# Patient Record
Sex: Male | Born: 1965 | Race: Black or African American | Hispanic: No | Marital: Married | Smoking: Never smoker
Health system: Southern US, Community
[De-identification: ages and names within clinical notes are randomized; demographics above are authoritative.]

## PROBLEM LIST (undated history)

## (undated) DIAGNOSIS — H5200 Hypermetropia, unspecified eye: Secondary | ICD-10-CM

## (undated) DIAGNOSIS — I10 Essential (primary) hypertension: Secondary | ICD-10-CM

## (undated) HISTORY — PX: FOOT SURGERY: SHX648

## (undated) HISTORY — DX: Hypermetropia, unspecified eye: H52.00

## (undated) HISTORY — DX: Essential (primary) hypertension: I10

---

## 2011-10-26 ENCOUNTER — Ambulatory Visit (INDEPENDENT_AMBULATORY_CARE_PROVIDER_SITE_OTHER): Payer: 59 | Admitting: Medical

## 2011-10-26 ENCOUNTER — Encounter: Payer: Self-pay | Admitting: Medical

## 2011-10-26 DIAGNOSIS — N508 Other specified disorders of male genital organs: Secondary | ICD-10-CM

## 2011-10-26 DIAGNOSIS — Z0000 Encounter for general adult medical examination without abnormal findings: Secondary | ICD-10-CM

## 2011-10-26 DIAGNOSIS — E119 Type 2 diabetes mellitus without complications: Secondary | ICD-10-CM | POA: Insufficient documentation

## 2011-10-26 DIAGNOSIS — N5089 Other specified disorders of the male genital organs: Secondary | ICD-10-CM | POA: Insufficient documentation

## 2011-10-26 DIAGNOSIS — I10 Essential (primary) hypertension: Secondary | ICD-10-CM

## 2011-10-26 DIAGNOSIS — E1159 Type 2 diabetes mellitus with other circulatory complications: Secondary | ICD-10-CM | POA: Insufficient documentation

## 2011-10-26 DIAGNOSIS — J40 Bronchitis, not specified as acute or chronic: Secondary | ICD-10-CM

## 2011-10-26 LAB — COMPREHENSIVE METABOLIC PANEL
Albumin: 5 g/dL (ref 3.5–5.2)
BUN: 16 mg/dL (ref 6–23)
CO2: 25 mEq/L (ref 19–32)
Calcium: 9.8 mg/dL (ref 8.4–10.5)
Chloride: 97 mEq/L (ref 96–112)
Glucose, Bld: 337 mg/dL — ABNORMAL HIGH (ref 70–99)
Potassium: 4.5 mEq/L (ref 3.5–5.3)

## 2011-10-26 LAB — LIPID PANEL
Cholesterol: 258 mg/dL — ABNORMAL HIGH (ref 0–200)
Total CHOL/HDL Ratio: 6.8 Ratio
Triglycerides: 364 mg/dL — ABNORMAL HIGH (ref <150)
VLDL: 73 mg/dL — ABNORMAL HIGH (ref 0–40)

## 2011-10-26 LAB — CBC WITH DIFFERENTIAL/PLATELET
Basophils Relative: 0 % (ref 0–1)
Hemoglobin: 13 g/dL (ref 13.0–17.0)
Lymphs Abs: 2.4 10*3/uL (ref 0.7–4.0)
Monocytes Relative: 3 % (ref 3–12)
Neutro Abs: 3.8 10*3/uL (ref 1.7–7.7)
Neutrophils Relative %: 59 % (ref 43–77)
RBC: 4.75 MIL/uL (ref 4.22–5.81)

## 2011-10-26 LAB — POCT URINALYSIS DIPSTICK
Leukocytes, UA: NEGATIVE
Spec Grav, UA: 1.015
Urobilinogen, UA: NEGATIVE

## 2011-10-26 LAB — PSA: PSA: 0.45 ng/mL (ref <=4.00)

## 2011-10-26 MED ORDER — METFORMIN HCL 500 MG PO TABS: 500.0000 mg | ORAL_TABLET | Freq: Two times a day (BID) | ORAL | Status: DC

## 2011-10-26 MED ORDER — LISINOPRIL 10 MG PO TABS: 10.0000 mg | ORAL_TABLET | Freq: Every day | ORAL | Status: DC

## 2011-10-26 MED ORDER — GLIPIZIDE 5 MG PO TABS: 5.0000 mg | ORAL_TABLET | Freq: Two times a day (BID) | ORAL | Status: DC

## 2011-10-26 MED ORDER — HYDROCHLOROTHIAZIDE 12.5 MG PO CAPS: 12.5000 mg | ORAL_CAPSULE | Freq: Every day | ORAL | Status: DC

## 2011-10-26 MED ORDER — AMOXICILLIN-POT CLAVULANATE 875-125 MG PO TABS: 1.0000 | ORAL_TABLET | Freq: Two times a day (BID) | ORAL | Status: AC

## 2011-10-26 NOTE — Patient Instructions (Signed)
Preventative Care for Adults, Male       REGULAR HEALTH EXAMS:  A routine yearly physical is a good way to check in with your primary care provider about your health and preventive screening. It is also an opportunity to share updates about your health and any concerns you have, and receive a thorough all-over exam.   Most health insurance companies pay for at least some preventative services.  Check with your health plan for specific coverages.  WHAT PREVENTATIVE SERVICES DO MEN NEED?  Adult men should have their weight and blood pressure checked regularly.   Men age 35 and older should have their cholesterol levels checked regularly.  Beginning at age 50 and continuing to age 75, men should be screened for colorectal cancer.  Certain people should may need continued testing until age 85.  Other cancer screening may include exams for testicular and prostate cancer.  Updating vaccinations is part of preventative care.  Vaccinations help protect against diseases such as the flu.  Lab tests are generally done as part of preventative care to screen for anemia and blood disorders, to screen for problems with the kidneys and liver, to screen for bladder problems, to check blood sugar, and to check your cholesterol level.  Preventative services generally include counseling about diet, exercise, avoiding tobacco, drugs, excessive alcohol consumption, and sexually transmitted infections.    GENERAL RECOMMENDATIONS FOR GOOD HEALTH:  Healthy diet:  Eat a variety of foods, including fruit, vegetables, animal or vegetable protein, such as meat, fish, chicken, and eggs, or beans, lentils, tofu, and grains, such as rice.  Drink plenty of water daily.  Decrease saturated fat in the diet, avoid lots of red meat, processed foods, sweets, fast foods, and fried foods.  Exercise:  Aerobic exercise helps maintain good heart health. At least 30-40 minutes of moderate-intensity exercise is recommended.  For example, a brisk walk that increases your heart rate and breathing. This should be done on most days of the week.   Find a type of exercise or a variety of exercises that you enjoy so that it becomes a part of your daily life.  Examples are running, walking, swimming, water aerobics, and biking.  For motivation and support, explore group exercise such as aerobic class, spin class, Zumba, Yoga,or  martial arts, etc.    Set exercise goals for yourself, such as a certain weight goal, walk or run in a race such as a 5k walk/run.  Speak to your primary care provider about exercise goals.  Disease prevention:  If you smoke or chew tobacco, find out from your caregiver how to quit. It can literally save your life, no matter how long you have been a tobacco user. If you do not use tobacco, never begin.   Maintain a healthy diet and normal weight. Increased weight leads to problems with blood pressure and diabetes.   The Body Mass Index or BMI is a way of measuring how much of your body is fat. Having a BMI above 27 increases the risk of heart disease, diabetes, hypertension, stroke and other problems related to obesity. Your caregiver can help determine your BMI and based on it develop an exercise and dietary program to help you achieve or maintain this important measurement at a healthful level.  High blood pressure causes heart and blood vessel problems.  Persistent high blood pressure should be treated with medicine if weight loss and exercise do not work.   Fat and cholesterol leaves deposits in your arteries   that can block them. This causes heart disease and vessel disease elsewhere in your body.  If your cholesterol is found to be high, or if you have heart disease or certain other medical conditions, then you may need to have your cholesterol monitored frequently and be treated with medication.   Ask if you should have a stress test if your history suggests this. A stress test is a test done on  a treadmill that looks for heart disease. This test can find disease prior to there being a problem.  Avoid drinking alcohol in excess (more than two drinks per day).  Avoid use of street drugs. Do not share needles with anyone. Ask for professional help if you need assistance or instructions on stopping the use of alcohol, cigarettes, and/or drugs.  Brush your teeth twice a day with fluoride toothpaste, and floss once a day. Good oral hygiene prevents tooth decay and gum disease. The problems can be painful, unattractive, and can cause other health problems. Visit your dentist for a routine oral and dental check up and preventive care every 6-12 months.   Look at your skin regularly.  Use a mirror to look at your back. Notify your caregivers of changes in moles, especially if there are changes in shapes, colors, a size larger than a pencil eraser, an irregular border, or development of new moles.  Safety:  Use seatbelts 100% of the time, whether driving or as a passenger.  Use safety devices such as hearing protection if you work in environments with loud noise or significant background noise.  Use safety glasses when doing any work that could send debris in to the eyes.  Use a helmet if you ride a bike or motorcycle.  Use appropriate safety gear for contact sports.  Talk to your caregiver about gun safety.  Use sunscreen with a SPF (or skin protection factor) of 15 or greater.  Lighter skinned people are at a greater risk of skin cancer. Don't forget to also wear sunglasses in order to protect your eyes from too much damaging sunlight. Damaging sunlight can accelerate cataract formation.   Practice safe sex. Use condoms. Condoms are used for birth control and to help reduce the spread of sexually transmitted infections (or STIs).  Some of the STIs are gonorrhea (the clap), chlamydia, syphilis, trichomonas, herpes, HPV (human papilloma virus) and HIV (human immunodeficiency virus) which causes AIDS.  The herpes, HIV and HPV are viral illnesses that have no cure. These can result in disability, cancer and death.   Keep carbon monoxide and smoke detectors in your home functioning at all times. Change the batteries every 6 months or use a model that plugs into the wall.   Vaccinations:  Stay up to date with your tetanus shots and other required immunizations. You should have a booster for tetanus every 10 years. Be sure to get your flu shot every year, since 5%-20% of the [place] population comes down with the flu. The flu vaccine changes each year, so being vaccinated once is not enough. Get your shot in the fall, before the flu season peaks.   Other vaccines to consider:  Pneumococcal vaccine to protect against certain types of pneumonia.  This is normally recommended for adults age 65 or older.  However, adults younger than 45 years old with certain underlying conditions such as diabetes, heart or lung disease should also receive the vaccine.  Shingles vaccine to protect against Varicella Zoster if you are older than age 60, or younger   than 45 years old with certain underlying illness.  Hepatitis A vaccine to protect against a form of infection of the liver by a virus acquired from food.  Hepatitis B vaccine to protect against a form of infection of the liver by a virus acquired from blood or body fluids, particularly if you work in health care.  If you plan to travel internationally, check with your local health department for specific vaccination recommendations.  Cancer Screening:  Most routine colon cancer screening begins at the age of 50. On a yearly basis, doctors may provide special easy to use take-home tests to check for hidden blood in the stool. Sigmoidoscopy or colonoscopy can detect the earliest forms of colon cancer and is life saving. These tests use a small camera at the end of a tube to directly examine the colon. Speak to your caregiver about this at age 50, when routine  screening begins (and is repeated every 5 years unless early forms of pre-cancerous polyps or small growths are found).   At the age of 50 men usually start screening for prostate cancer every year. Screening may begin at a younger age for those with higher risk. Those at higher risk include African-Americans or having a family history of prostate cancer. There are two types of tests for prostate cancer:   Prostate-specific antigen (PSA) testing. Recent studies raise questions about prostate cancer using PSA and you should discuss this with your caregiver.   Digital rectal exam (in which your doctor's lubricated and gloved finger feels for enlargement of the prostate through the anus).   Screening for testicular cancer.  Do a monthly exam of your testicles. Gently roll each testicle between your thumb and fingers, feeling for any abnormal lumps. The best time to do this is after a hot shower or bath when the tissues are looser. Notify your caregivers of any lumps, tenderness or changes in size or shape immediately.     

## 2011-10-26 NOTE — Progress Notes (Signed)
Subjective:   HPI  Paul Dudley is a 45 y.o. male who presents as a new patient today for a complete physical.  Accompanied by his wife today.  He moved here from [place], [place], and last physician was Dr. Derrill Memo.  He reports 2 wk hx/o cold, cough, congestion in chest and throat, productive sputum, subj fever, using Tylenol Cold and Robitussin with expectorant which helps some.  Last illness was 1.5 year ago.   Has hx/o diabetes type II.   Checks glucose regularly, never has readings under 150.  Usually 160-180 fasting.  Afternoons after getting off work usually around 180s.  Checks feet daily.  Last eye exam within the year.  Doesn't check blood pressure regularly.  Needs refills on his medications, usually gets 90 day supply.  No other aggravating or relieving factors.  Last tdap 2011.  No other c/o.  The following portions of the patient's history were reviewed and updated as appropriate: allergies, current medications, past family history, past medical history, past social history, past surgical history and problem list.   Past Medical History  Diagnosis Date  . Diabetes mellitus 04/2006  . Hypertension   . Farsightedness     wears glasses    Past Surgical History  Procedure Date  . Foot surgery     foreign body removal (glass), left foot    Family History  Problem Relation Age of Onset  . Cancer Mother     small cell lung cancer  . Diabetes Father   . Blindness Father   . Kidney disease Father     dialysis  . Cancer Brother     died testicular cancer 71 yo  . Cancer Maternal Uncle     small cell lung cancer  . Heart disease Paternal Uncle   . Stroke Maternal Grandmother   . Hypertension Maternal Grandmother     History   Social History  . Marital Status: Married    Spouse Name: N/A    Number of Children: N/A  . Years of Education: N/A   Occupational History  . 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 Smoking status: Never Smoker   .  Smokeless tobacco: Not on file  . Alcohol Use: No  . Drug Use: No  . Sexually Active: Not on file   Other Topics Concern  . 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    Allergies  Allergen Reactions  . Erythromycin    Review of Systems Constitutional: +fever, +chills, +sweats, -unexpected -weight change,+fatigue ENT: -runny nose, -ear pain, +sore throat Cardiology:  -chest pain, -palpitations, -edema Respiratory: +cough, -shortness of breath, -wheezing Gastroenterology: -abdominal pain, -nausea, -vomiting, +diarrhea, -constipation Hematology: -bleeding or bruising problems Musculoskeletal: -arthralgias, -myalgias, -joint swelling, +back pain Ophthalmology: -vision changes Urology: -dysuria, -difficulty urinating, -hematuria, +urinary frequency, -urgency Neurology: -headache, -weakness, -tingling, -numbness       Objective:   Physical Exam  Filed Vitals:   10/26/11 1415  BP: 140/80  Pulse: 88  Temp: 98.2 F (36.8 C)  Resp: 16    General appearance: alert, no distress, WD/WN, black male Skin: no foot lesions, no worrisome lesions HEENT: normocephalic, conjunctiva/corneas normal, sclerae anicteric, PERRLA, EOMi, nares patent, no discharge or erythema, pharynx normal Oral cavity: MMM,  tongue normal, teeth in good repair Neck: supple, no lymphadenopathy, no thyromegaly, no masses, normal ROM, no bruits Chest: non tender, normal shape and expansion Heart: RRR, normal S1, S2, no murmurs Lungs: CTA bilaterally, no wheezes, rhonchi, or rales Abdomen: +bs, soft, non tender, non distended, no masses, no hepatomegaly, no splenomegaly, no bruits Back: non tender, normal ROM, no scoliosis Musculoskeletal: upper extremities non tender, no obvious deformity,  normal ROM throughout, lower extremities non tender, no obvious deformity, normal ROM throughout Extremities: no edema, no cyanosis, no clubbing Pulses: 2+ symmetric, upper and lower extremities, normal cap refill Neurological: alert, oriented x 3, CN2-12 intact, strength normal upper extremities and lower extremities, sensation normal throughout, DTRs 2+ throughout, no cerebellar signs, gait normal, normal monofilament exam Psychiatric: normal affect, behavior normal, pleasant  GU: right scrotum with 1.5cm x 2cm nodular solid mass superior to the testicle, nontender, otherwise normal male external genitalia, nontender, no masses, no hernia, no lymphadenopathy Rectal: anus normal, prostate WNL, no nodules/mass   Assessment and Plan :    Encounter Diagnoses  Name Primary?  . General medical examination Yes  . Type II or unspecified type diabetes mellitus without mention of complication, not stated as uncontrolled   . Essential hypertension, benign   . Bronchitis   . Scrotal mass     Physical exam - discussed healthy lifestyle, diet, exercise, preventative care, vaccinations, and addressed their concerns.  Declines flu and pneumococcal vaccines.    DM type II - been out of medication for 3 mo.   Refilled medication, labs today, and will likely need to modify medications/regimen in 67mo.  HTN - c/t same medication, low salt diet  Bronchitis - script for Augmentin, discussed supportive care  Scrotal mass - ultrasound   Follow-up pending studies

## 2011-10-30 ENCOUNTER — Telehealth: Payer: Self-pay | Admitting: Family Medicine

## 2011-10-30 NOTE — Telephone Encounter (Signed)
Message copied by Janeice Robinson on Tue Oct 30, 2011  9:44 AM ------      Message from: [place], Kermit Balo      Created: Fri Oct 26, 2011 10:41 PM       Reminder - order for scrotal ultrasound

## 2011-10-30 NOTE — Telephone Encounter (Signed)
PATIENT HAS HIS U/S APPT. ON 10/31/11 @1145AM  [facility]. CLS

## 2011-10-31 ENCOUNTER — Other Ambulatory Visit: Payer: 59

## 2011-11-02 ENCOUNTER — Other Ambulatory Visit: Payer: 59

## 2011-11-07 ENCOUNTER — Ambulatory Visit
Admission: RE | Admit: 2011-11-07 | Discharge: 2011-11-07 | Disposition: A | Discharge status: Home / Self Care | Payer: 59 | Source: Ambulatory Visit | Attending: Medical | Admitting: Medical

## 2012-06-12 ENCOUNTER — Ambulatory Visit (INDEPENDENT_AMBULATORY_CARE_PROVIDER_SITE_OTHER): Payer: Self-pay | Admitting: Family Medicine

## 2012-06-12 ENCOUNTER — Telehealth: Payer: Self-pay | Admitting: Internal Medicine

## 2012-06-12 ENCOUNTER — Encounter: Payer: Self-pay | Admitting: Family Medicine

## 2012-06-12 ENCOUNTER — Other Ambulatory Visit: Payer: Self-pay

## 2012-06-12 ENCOUNTER — Telehealth: Payer: Self-pay

## 2012-06-12 VITALS — BP 128/80 | HR 109 | Ht 67.0 in | Wt 168.0 lb

## 2012-06-12 DIAGNOSIS — E119 Type 2 diabetes mellitus without complications: Secondary | ICD-10-CM

## 2012-06-12 LAB — POCT GLYCOSYLATED HEMOGLOBIN (HGB A1C): Hemoglobin A1C: 10.7

## 2012-06-12 MED ORDER — METFORMIN HCL ER 750 MG PO TB24: 750.0000 mg | ORAL_TABLET | Freq: Two times a day (BID) | ORAL | Status: DC

## 2012-06-12 MED ORDER — PIOGLITAZONE HCL-METFORMIN HCL 15-850 MG PO TABS: 1.0000 | ORAL_TABLET | Freq: Two times a day (BID) | ORAL | Status: DC

## 2012-06-12 NOTE — Telephone Encounter (Signed)
Done by cheri °

## 2012-06-12 NOTE — Telephone Encounter (Signed)
Check with the pharmacist and did divide Actos up giving 15 mg twice a day as well as metformin 750 ER twice a day

## 2012-06-12 NOTE — Telephone Encounter (Signed)
Dr.lalonde to get the actos gen. 15 mg bid is 360.00 a month and the Metformin 750 er mg bid is 25.97 so this is more that the actos plus met 15-850 bid which is 271.00 pt cant afforded please advise

## 2012-06-12 NOTE — Patient Instructions (Addendum)
Sugars before eating in the low 100s and after eating below 180. If you have problems with diarrhea call me.

## 2012-06-12 NOTE — Progress Notes (Signed)
  Subjective:    Patient ID: Paul Dudley, male    DOB: 1965/12/20, 46 y.o.   MRN: [id number]  HPI He is here for followup visit. He has been taking his metformin and Glucotrol regularly. He also continues on his other medications. He has had no difficulties. He was diagnosed with diabetes several years ago when he was living in [place]. He does state that he has been through diabetes education process and has a good handle on it.  Review of Systems     Objective:   Physical Exam Alert and in no distress. Hemoglobin A1c is 10.7       Assessment & Plan:   1. Diabetes mellitus  HgB A1c, Amb ref to [facility]-M[facility], pioglitazone-metformin (ACTOPLUS MET) 15-850 MG per tablet   paperwork was filled out to allow him to return to work. He is to monitor his blood sugars. The pharmacy cost her Actos plus was too expensive and will therefore divide the dosing up.

## 2012-06-12 NOTE — Telephone Encounter (Signed)
Will you do this too

## 2012-06-12 NOTE — Telephone Encounter (Signed)
PT INFORMED AND UNDERSTANDS

## 2012-06-12 NOTE — Telephone Encounter (Signed)
Give him the highest form of extended release that we can twice per day and have him keep his appointment. Explained that we really don't have any other choice at this time and keep him on the 2 medications he is on only metformin at a higher dose

## 2012-06-13 ENCOUNTER — Encounter: Payer: Self-pay | Admitting: Family Medicine

## 2012-06-18 ENCOUNTER — Telehealth: Payer: Self-pay

## 2012-06-18 NOTE — Telephone Encounter (Signed)
Printed out phone message due to patient only having an paper chart.

## 2012-06-18 NOTE — Telephone Encounter (Signed)
Pt needs a copy of his long form.   Please call when ready for pick kup [phone]

## 2012-06-24 ENCOUNTER — Telehealth: Payer: Self-pay | Admitting: Internal Medicine

## 2012-06-24 MED ORDER — LISINOPRIL 10 MG PO TABS: 10.0000 mg | ORAL_TABLET | Freq: Every day | ORAL | Status: DC

## 2012-06-24 NOTE — Telephone Encounter (Signed)
RX was sent to the pharmacy. CLS 

## 2012-07-02 ENCOUNTER — Ambulatory Visit: Payer: Self-pay | Admitting: Dietician

## 2012-08-01 ENCOUNTER — Ambulatory Visit: Payer: Self-pay | Admitting: Family Medicine

## 2012-08-22 ENCOUNTER — Ambulatory Visit: Payer: Self-pay | Admitting: Family Medicine

## 2012-08-29 ENCOUNTER — Ambulatory Visit: Payer: Self-pay | Admitting: Family Medicine

## 2012-09-10 ENCOUNTER — Ambulatory Visit: Payer: Self-pay | Admitting: Family Medicine

## 2012-09-19 ENCOUNTER — Ambulatory Visit: Payer: Self-pay | Admitting: Family Medicine

## 2012-09-26 ENCOUNTER — Ambulatory Visit: Payer: Self-pay | Admitting: Family Medicine

## 2012-11-03 ENCOUNTER — Telehealth: Payer: Self-pay | Admitting: Internal Medicine

## 2012-11-03 MED ORDER — GLIPIZIDE 5 MG PO TABS: 5.0000 mg | ORAL_TABLET | Freq: Two times a day (BID) | ORAL | Status: DC

## 2012-11-03 NOTE — Telephone Encounter (Signed)
PATIENTS RX WAS SENT INTO THE PHARMACY. CLS

## 2012-11-06 ENCOUNTER — Telehealth: Payer: Self-pay | Admitting: Internal Medicine

## 2012-11-06 MED ORDER — GLIPIZIDE 5 MG PO TABS: 5.0000 mg | ORAL_TABLET | Freq: Two times a day (BID) | ORAL | Status: DC

## 2012-11-06 NOTE — Telephone Encounter (Signed)
Glipizide 5mg  sent to wrong pharmacy. Resent to right pharmacy at [facility]

## 2012-12-24 ENCOUNTER — Other Ambulatory Visit: Payer: Self-pay | Admitting: Internal Medicine

## 2012-12-24 MED ORDER — HYDROCHLOROTHIAZIDE 12.5 MG PO CAPS: 12.5000 mg | ORAL_CAPSULE | Freq: Every day | ORAL | Status: DC

## 2012-12-24 NOTE — Telephone Encounter (Signed)
I sent the refill request into the pharmacy for the patient. CLS

## 2013-03-02 ENCOUNTER — Other Ambulatory Visit: Payer: Self-pay | Admitting: Orthopedic Surgery

## 2013-03-02 ENCOUNTER — Encounter (HOSPITAL_COMMUNITY): Payer: Self-pay | Admitting: Pharmacy Technician

## 2013-03-02 NOTE — Progress Notes (Signed)
Need orders put in EPIC please - pt coming for preop 03/03/13 - thank you

## 2013-03-03 ENCOUNTER — Encounter (HOSPITAL_COMMUNITY)
Admission: RE | Admit: 2013-03-03 | Discharge: 2013-03-03 | Disposition: A | Discharge status: Home / Self Care | Payer: Worker's Compensation | Source: Ambulatory Visit | Attending: Specialist | Admitting: Specialist

## 2013-03-03 ENCOUNTER — Encounter (HOSPITAL_COMMUNITY): Payer: Self-pay

## 2013-03-03 ENCOUNTER — Ambulatory Visit (HOSPITAL_COMMUNITY)
Admission: RE | Admit: 2013-03-03 | Discharge: 2013-03-03 | Disposition: A | Discharge status: Home / Self Care | Payer: Worker's Compensation | Source: Ambulatory Visit | Attending: Specialist | Admitting: Specialist

## 2013-03-03 ENCOUNTER — Ambulatory Visit (HOSPITAL_COMMUNITY)
Admission: RE | Admit: 2013-03-03 | Discharge: 2013-03-03 | Disposition: A | Discharge status: Home / Self Care | Payer: Worker's Compensation | Source: Ambulatory Visit | Attending: Orthopedic Surgery | Admitting: Orthopedic Surgery

## 2013-03-03 DIAGNOSIS — Z01812 Encounter for preprocedural laboratory examination: Secondary | ICD-10-CM | POA: Insufficient documentation

## 2013-03-03 DIAGNOSIS — M5126 Other intervertebral disc displacement, lumbar region: Secondary | ICD-10-CM | POA: Insufficient documentation

## 2013-03-03 DIAGNOSIS — Z01810 Encounter for preprocedural cardiovascular examination: Secondary | ICD-10-CM | POA: Insufficient documentation

## 2013-03-03 DIAGNOSIS — I10 Essential (primary) hypertension: Secondary | ICD-10-CM | POA: Insufficient documentation

## 2013-03-03 DIAGNOSIS — M47817 Spondylosis without myelopathy or radiculopathy, lumbosacral region: Secondary | ICD-10-CM | POA: Insufficient documentation

## 2013-03-03 DIAGNOSIS — Z01818 Encounter for other preprocedural examination: Secondary | ICD-10-CM | POA: Insufficient documentation

## 2013-03-03 DIAGNOSIS — E119 Type 2 diabetes mellitus without complications: Secondary | ICD-10-CM | POA: Insufficient documentation

## 2013-03-03 LAB — CBC
HCT: 36.4 % — ABNORMAL LOW (ref 39.0–52.0)
MCHC: 33.2 g/dL (ref 30.0–36.0)
MCV: 83.9 fL (ref 78.0–100.0)
RDW: 12.5 % (ref 11.5–15.5)

## 2013-03-03 LAB — BASIC METABOLIC PANEL
BUN: 12 mg/dL (ref 6–23)
CO2: 24 mEq/L (ref 19–32)
Chloride: 98 mEq/L (ref 96–112)
Creatinine, Ser: 0.83 mg/dL (ref 0.50–1.35)
GFR calc Af Amer: >90 mL/min (ref >90)

## 2013-03-03 NOTE — Patient Instructions (Addendum)
20 Rahsaan Weakland  03/03/2013   Your procedure is scheduled on:  03/11/13  [facility]  Report to [facility] at 0530      AM.  Call this number if you have problems the morning of surgery: [phone]       Remember:   Do not eat food  Or drink :After Midnight.TUESDAY NIGHT   Take these medicines the morning of surgery with A SIP OF WATER:   OXYCODONE IF NEEDED DO NOT TAKE ANY DIABETES MEDICINE Wednesday MORNING  .  Contacts, dentures or partial plates can not be worn to surgery  Leave suitcase in the car. After surgery it may be brought to your room.  For patients admitted to the hospital, checkout time is 11:00 AM day of  discharge.             SPECIAL INSTRUCTIONS- SEE [facility] PREPARING FOR SURGERY INSTRUCTION SHEET-     DO NOT WEAR JEWELRY, LOTIONS, POWDERS, OR PERFUMES.  WOMEN-- DO NOT SHAVE LEGS OR UNDERARMS FOR 12 HOURS BEFORE SHOWERS. MEN MAY SHAVE FACE.  Patients discharged the day of surgery will not be allowed to drive home. IF going home the day of surgery, you must have a driver and someone to stay with you for the first 24 hours  Name and phone number of your driver:  Wife  LISA                                                                        Please read over the following fact sheets that you were given: MRSA Information, Incentive Spirometry Sheet, Blood Transfusion Sheet  Information                                                                                   Jazaria Jarecki  PST 336  [id number]                 FAILURE TO FOLLOW THESE INSTRUCTIONS MAY RESULT IN  CANCELLATION   OF YOUR SURGERY                                                  Patient Signature _____________________________

## 2013-03-05 ENCOUNTER — Telehealth: Payer: Self-pay | Admitting: Family Medicine

## 2013-03-05 NOTE — Telephone Encounter (Signed)
Have him set up an appointment 

## 2013-03-06 NOTE — Telephone Encounter (Signed)
Patient is aware and he scheduled his appointment. CLS

## 2013-03-10 ENCOUNTER — Telehealth: Payer: Self-pay | Admitting: Family Medicine

## 2013-03-10 ENCOUNTER — Other Ambulatory Visit: Payer: Self-pay | Admitting: Orthopedic Surgery

## 2013-03-10 NOTE — Telephone Encounter (Signed)
Spoke with patient regarding needing visit prior to his surgery tomorrow.  Pt will reschedule surgery for his back.  Pt will be coming in Thurs to see Dr. Susann Givens. I also called [facility] @ Dr. Veronda Prude office [phone].  She is requesting a note from Dr. Susann Givens after his office visit with patient to advise Dr. Jillyn Hidden if his blood sugars are under control.

## 2013-03-11 ENCOUNTER — Encounter (HOSPITAL_COMMUNITY): Admission: RE | Payer: Self-pay | Source: Ambulatory Visit

## 2013-03-11 ENCOUNTER — Ambulatory Visit (HOSPITAL_COMMUNITY): Admission: RE | Admit: 2013-03-11 | Payer: Worker's Compensation | Source: Ambulatory Visit | Admitting: Specialist

## 2013-03-11 SURGERY — LUMBAR LAMINECTOMY/DECOMPRESSION MICRODISCECTOMY 1 LEVEL
Anesthesia: General | Laterality: Right

## 2013-03-12 ENCOUNTER — Ambulatory Visit (INDEPENDENT_AMBULATORY_CARE_PROVIDER_SITE_OTHER): Payer: Worker's Compensation | Admitting: Family Medicine

## 2013-03-12 ENCOUNTER — Encounter: Payer: Self-pay | Admitting: Family Medicine

## 2013-03-12 VITALS — BP 110/70 | HR 100 | Ht 67.0 in | Wt 164.0 lb

## 2013-03-12 DIAGNOSIS — Z79899 Other long term (current) drug therapy: Secondary | ICD-10-CM

## 2013-03-12 DIAGNOSIS — I10 Essential (primary) hypertension: Secondary | ICD-10-CM

## 2013-03-12 DIAGNOSIS — E1169 Type 2 diabetes mellitus with other specified complication: Secondary | ICD-10-CM

## 2013-03-12 DIAGNOSIS — E119 Type 2 diabetes mellitus without complications: Secondary | ICD-10-CM

## 2013-03-12 LAB — LIPID PANEL
Cholesterol: 226 mg/dL — ABNORMAL HIGH (ref 0–200)
HDL: 34 mg/dL — ABNORMAL LOW (ref >39)
Total CHOL/HDL Ratio: 6.6 Ratio
Triglycerides: 223 mg/dL — ABNORMAL HIGH (ref <150)

## 2013-03-12 LAB — POCT GLYCOSYLATED HEMOGLOBIN (HGB A1C): Hemoglobin A1C: 9.7

## 2013-03-12 MED ORDER — PIOGLITAZONE HCL-METFORMIN HCL 15-850 MG PO TABS: 1.0000 | ORAL_TABLET | Freq: Two times a day (BID) | ORAL | Status: DC

## 2013-03-12 NOTE — Progress Notes (Signed)
  Subjective:    Patient ID: Anquan Azzarello, male    DOB: 11-10-1966, 47 y.o.   MRN: [id number]  HPI He is here for medication check. He was last seen here June 27. He did lose his insurance and therefore did not followup concerning his diabetes.he has been on medications listed in the chart. He has been checking his blood sugars and states that they were never above 180. He has been having back pain and was scheduled for surgery in tell a blood sugar reading of in the low 200s was found.   Review of Systems     Objective:   Physical Exam Alert and in no distress  Otherwise not examined. Hemoglobin A1c is 9.7       Assessment & Plan:  Type II or unspecified type diabetes mellitus without mention of complication, not stated as uncontrolled - Plan: POCT UA - Microalbumin, POCT glycosylated hemoglobin (Hb A1C), pioglitazone-metformin (ACTOPLUS MET) 15-850 MG per tablet, CANCELED: POCT glycosylated hemoglobin (Hb A1C)  Hypertension associated with diabetes  Encounter for long-term (current) use of other medications - Plan: Lipid panel he was on Actos plus in the past and I will therefore place him back on it. Also discussed the need for him to make sure his machine is working properly. We then discussed symptoms of low blood sugar. He will let me know if he has any difficulties with that. I will see him in one month.

## 2013-03-12 NOTE — Patient Instructions (Signed)
If there is any question about your glucometer, put a new battery and and if that doesn't work call us and we'll call in for a new one. Work on getting your blood sugars under 180 after you eat. If you note that your blood sugars before you eat are in the 70 or lower range and you feel weak let me know

## 2013-03-12 NOTE — Progress Notes (Signed)
LAST DIABETIC EYE EXAM:11-2012 LAST FOOT EXAM:05-2012 PT TEST 2 TIMES A DAY B/S 185AM AND 200PM

## 2013-03-12 NOTE — Telephone Encounter (Signed)
PT IS HERE TODAY I HAVE PRINTED OUT PHONE CALL SO WE CAN GET ALL INFO TO DR.

## 2013-03-13 ENCOUNTER — Other Ambulatory Visit: Payer: Self-pay | Admitting: *Deleted

## 2013-03-13 DIAGNOSIS — I10 Essential (primary) hypertension: Secondary | ICD-10-CM

## 2013-03-13 DIAGNOSIS — E785 Hyperlipidemia, unspecified: Secondary | ICD-10-CM

## 2013-03-13 MED ORDER — LISINOPRIL 10 MG PO TABS: 10.0000 mg | ORAL_TABLET | Freq: Every day | ORAL | Status: DC

## 2013-03-13 MED ORDER — ATORVASTATIN CALCIUM 20 MG PO TABS: 20.0000 mg | ORAL_TABLET | Freq: Every day | ORAL | Status: DC

## 2013-03-19 ENCOUNTER — Ambulatory Visit: Payer: Self-pay | Admitting: Family Medicine

## 2013-03-23 ENCOUNTER — Telehealth: Payer: Self-pay | Admitting: Family Medicine

## 2013-03-23 NOTE — Telephone Encounter (Signed)
Pt called and asked that I send labs,chol & a1c to [facility]  Attn:  Gaye Pollack fax [phone]. Done.

## 2013-04-13 ENCOUNTER — Ambulatory Visit (INDEPENDENT_AMBULATORY_CARE_PROVIDER_SITE_OTHER): Payer: Worker's Compensation | Admitting: Family Medicine

## 2013-04-13 ENCOUNTER — Encounter: Payer: Self-pay | Admitting: Family Medicine

## 2013-04-13 VITALS — BP 120/78 | HR 84 | Wt 167.0 lb

## 2013-04-13 DIAGNOSIS — E1159 Type 2 diabetes mellitus with other circulatory complications: Secondary | ICD-10-CM

## 2013-04-13 DIAGNOSIS — E1169 Type 2 diabetes mellitus with other specified complication: Secondary | ICD-10-CM

## 2013-04-13 DIAGNOSIS — I10 Essential (primary) hypertension: Secondary | ICD-10-CM

## 2013-04-13 DIAGNOSIS — E119 Type 2 diabetes mellitus without complications: Secondary | ICD-10-CM

## 2013-04-13 MED ORDER — LISINOPRIL-HYDROCHLOROTHIAZIDE 10-12.5 MG PO TABS: 1.0000 | ORAL_TABLET | Freq: Every day | ORAL | Status: DC

## 2013-04-13 MED ORDER — GLIPIZIDE 5 MG PO TABS: 5.0000 mg | ORAL_TABLET | Freq: Two times a day (BID) | ORAL | Status: DC

## 2013-04-13 NOTE — Progress Notes (Signed)
  Subjective:    Patient ID: Paul Dudley, male    DOB: 1966/10/28, 47 y.o.   MRN: [id number]  HPI He is here for a recheck. He states his blood sugars have been running below 160 for the last month. He checks them in the morning and again in the evening but not 2 hours after meals or necessarily before meals.   Review of Systems     Objective:   Physical Exam Alert and in no distress. Hemoglobin A1c is 9.4.       Assessment & Plan:  Hypertension associated with diabetes - Plan: lisinopril-hydrochlorothiazide (PRINZIDE,ZESTORETIC) 10-12.5 MG per tablet  Type II or unspecified type diabetes mellitus without mention of complication, not stated as uncontrolled - Plan: POCT glycosylated hemoglobin (Hb A1C), glipiZIDE (GLUCOTROL) 5 MG tablet recommend that he have his glucometer checked. He might need new battery. Also recommend he check his blood sugars either before a meal or 2 hours after a meal. Recheck here in 2 months. Apparently the orthopedic surgeon wants to hold off on his surgery until his diabetes is better controlled.

## 2013-04-13 NOTE — Patient Instructions (Signed)
Check your blood sugars either before a meal or 2 hours after a meal. 2 hours after a meal your blood sugar should be below 180. Make sure your machine is accurate. Put a new battery and

## 2013-06-09 ENCOUNTER — Encounter: Payer: Self-pay | Admitting: Family Medicine

## 2013-06-09 ENCOUNTER — Ambulatory Visit (INDEPENDENT_AMBULATORY_CARE_PROVIDER_SITE_OTHER): Payer: Worker's Compensation | Admitting: Family Medicine

## 2013-06-09 VITALS — BP 130/80 | HR 100 | Wt 169.0 lb

## 2013-06-09 DIAGNOSIS — E119 Type 2 diabetes mellitus without complications: Secondary | ICD-10-CM

## 2013-06-09 DIAGNOSIS — I10 Essential (primary) hypertension: Secondary | ICD-10-CM

## 2013-06-09 DIAGNOSIS — E1159 Type 2 diabetes mellitus with other circulatory complications: Secondary | ICD-10-CM

## 2013-06-09 DIAGNOSIS — E1169 Type 2 diabetes mellitus with other specified complication: Secondary | ICD-10-CM

## 2013-06-09 MED ORDER — GLIPIZIDE 5 MG PO TABS: 5.0000 mg | ORAL_TABLET | Freq: Two times a day (BID) | ORAL | Status: DC

## 2013-06-09 MED ORDER — PIOGLITAZONE HCL-METFORMIN HCL 15-850 MG PO TABS: 1.0000 | ORAL_TABLET | Freq: Two times a day (BID) | ORAL | Status: DC

## 2013-06-09 MED ORDER — LISINOPRIL-HYDROCHLOROTHIAZIDE 10-12.5 MG PO TABS: 1.0000 | ORAL_TABLET | Freq: Every day | ORAL | Status: DC

## 2013-06-09 NOTE — Progress Notes (Signed)
  Subjective:    Patient ID: Paul Dudley, male    DOB: 12-12-1966, 47 y.o.   MRN: [id number]  HPI He is here for recheck on his diabetes. He has made dietary changes and notes that his blood sugars fasting are running 150-160. He is scheduled for surgery but needs his diabetes under better control.   Review of Systems     Objective:   Physical Exam Alert and in no distress. Hemoglobin A1c is 8.1       Assessment & Plan:  Hypertension associated with diabetes - Plan: lisinopril-hydrochlorothiazide (PRINZIDE,ZESTORETIC) 10-12.5 MG per tablet  Type II or unspecified type diabetes mellitus without mention of complication, not stated as uncontrolled - Plan: POCT glycosylated hemoglobin (Hb A1C), pioglitazone-metformin (ACTOPLUS MET) 15-850 MG per tablet, glipiZIDE (GLUCOTROL) 5 MG tablet   I will send a note to Dr. Jillyn Hidden concerning clearing him for surgery

## 2013-06-09 NOTE — Progress Notes (Signed)
  Subjective:    Paul Dudley is a 47 y.o. male who presents for follow-up of Type 2 diabetes mellitus.    Home blood sugar records: 158 to 168  Current symptoms/problems none Daily foot checks, foot concerns: yes/no Last eye exam:  12/13   Medication compliance: Current diet: watching what he eats low carbs Current exercise: walking/pt  Known diabetic complications: none Cardiovascular risk factors: none   The following portions of the patient's history were reviewed and updated as appropriate: allergies, current medications, past family history, past medical history, past social history, past surgical history and problem list.  ROS as in subjective above    Objective:    Wt 169 lb (76.658 kg)  BMI 26.46 kg/m2  There were no vitals filed for this visit.  General appearence: alert, no distress, WD/WN Neck: supple, no lymphadenopathy, no thyromegaly, no masses Heart: RRR, normal S1, S2, no murmurs Lungs: CTA bilaterally, no wheezes, rhonchi, or rales Abdomen: +bs, soft, non tender, non distended, no masses, no hepatomegaly, no splenomegaly Pulses: 2+ symmetric, upper and lower extremities, normal cap refill Ext: no edema Foot exam:  Neuro: foot monofilament exam normal   Lab Review Lab Results  Component Value Date   HGBA1C 9.4 04/13/2013   Lab Results  Component Value Date   CHOL 226* 03/12/2013   HDL 34* 03/12/2013   LDLCALC 147* 03/12/2013   TRIG 223* 03/12/2013   CHOLHDL 6.6 03/12/2013   No results found for this basenameConcepcion Elk     Chemistry      Component Value Date/Time   NA 136 03/03/2013 1505   K 4.0 03/03/2013 1505   CL 98 03/03/2013 1505   CO2 24 03/03/2013 1505   BUN 12 03/03/2013 1505   CREATININE 0.83 03/03/2013 1505   CREATININE 1.10 10/26/2011 1451      Component Value Date/Time   CALCIUM 9.9 03/03/2013 1505   ALKPHOS 68 10/26/2011 1451   AST 19 10/26/2011 1451   ALT 26 10/26/2011 1451   BILITOT 0.5 10/26/2011 1451       Chemistry      Component Value Date/Time   NA 136 03/03/2013 1505   K 4.0 03/03/2013 1505   CL 98 03/03/2013 1505   CO2 24 03/03/2013 1505   BUN 12 03/03/2013 1505   CREATININE 0.83 03/03/2013 1505   CREATININE 1.10 10/26/2011 1451      Component Value Date/Time   CALCIUM 9.9 03/03/2013 1505   ALKPHOS 68 10/26/2011 1451   AST 19 10/26/2011 1451   ALT 26 10/26/2011 1451   BILITOT 0.5 10/26/2011 1451       Last optometry/ophthalmology exam reviewed from:    Assessment:        Plan:    1.  Rx changes: none 2.  Education: Reviewed 'ABCs' of diabetes management (respective goals in parentheses):  A1C (<7), blood pressure (<130/80), and cholesterol (LDL <100). 3.  Compliance at present is estimated to be good. Efforts to improve compliance (if necessary) will be directed at none. 4. Follow up: 4 months

## 2013-06-11 NOTE — Progress Notes (Signed)
Surgery scheduled for 06/24/13.  Preop on 06/18/13 at 0930am.  Need orders in EPIC.  Thank You.

## 2013-06-15 ENCOUNTER — Other Ambulatory Visit: Payer: Self-pay | Admitting: Orthopedic Surgery

## 2013-06-15 ENCOUNTER — Encounter (HOSPITAL_COMMUNITY): Payer: Self-pay | Admitting: Pharmacy Technician

## 2013-06-18 ENCOUNTER — Other Ambulatory Visit (HOSPITAL_COMMUNITY): Payer: Self-pay | Admitting: *Deleted

## 2013-06-18 ENCOUNTER — Encounter (HOSPITAL_COMMUNITY)
Admission: RE | Admit: 2013-06-18 | Discharge: 2013-06-18 | Disposition: A | Discharge status: Home / Self Care | Payer: Worker's Compensation | Source: Ambulatory Visit | Attending: Specialist | Admitting: Specialist

## 2013-06-18 ENCOUNTER — Ambulatory Visit (HOSPITAL_COMMUNITY)
Admission: RE | Admit: 2013-06-18 | Discharge: 2013-06-18 | Disposition: A | Discharge status: Home / Self Care | Payer: Worker's Compensation | Source: Ambulatory Visit | Attending: Orthopedic Surgery | Admitting: Orthopedic Surgery

## 2013-06-18 ENCOUNTER — Encounter (HOSPITAL_COMMUNITY): Payer: Self-pay

## 2013-06-18 DIAGNOSIS — M545 Low back pain, unspecified: Secondary | ICD-10-CM | POA: Insufficient documentation

## 2013-06-18 DIAGNOSIS — Z01818 Encounter for other preprocedural examination: Secondary | ICD-10-CM | POA: Insufficient documentation

## 2013-06-18 LAB — CBC
HCT: 38 % — ABNORMAL LOW (ref 39.0–52.0)
Hemoglobin: 12.5 g/dL — ABNORMAL LOW (ref 13.0–17.0)
MCH: 27.7 pg (ref 26.0–34.0)
MCV: 84.1 fL (ref 78.0–100.0)
RBC: 4.52 MIL/uL (ref 4.22–5.81)
WBC: 5.7 10*3/uL (ref 4.0–10.5)

## 2013-06-18 LAB — BASIC METABOLIC PANEL
CO2: 26 mEq/L (ref 19–32)
Chloride: 97 mEq/L (ref 96–112)
Glucose, Bld: 227 mg/dL — ABNORMAL HIGH (ref 70–99)
Sodium: 137 mEq/L (ref 135–145)

## 2013-06-18 NOTE — Patient Instructions (Signed)
20      Your procedure is scheduled on:   Wednesday 06/24/2013  Report to [facility] at  0630 AM.  Call this number if you have problems the morning of surgery: [phone]   Remember:             IF YOU USE CPAP,BRING MASK AND TUBING AM OF SURGERY!   Do not eat food or drink liquids AFTER MIDNIGHT!  Take these medicines the morning of surgery with A SIP OF WATER:    Do not bring valuables to the hospital. [facility] IS NOT RESPONSIBLE   FOR ANY BELONGINGS OR VALUABLES.  Wynelle Fanny suitcase in the car. After surgery it may be brought to your room.  For patients admitted to the hospital, checkout time is 11:00 AM the day of              Discharge.    DO NOT WEAR JEWELRY , MAKE-UP, LOTIONS,POWDERS,PERFUMES!             WOMEN -DO NOT SHAVE LEGS OR UNDERARMS 12 HRS. BEFORE SURGERY!               MEN MAY SHAVE AS USUAL!             CONTACTS,DENTURES OR BRIDGEWORK, FALSE EYELASHES MAY NOT BE WORN INTO SURGERY!                                           Patients discharged the day of surgery will not be allowed to drive home.   If going home the same day of surgery, must have someone stay with you  first 24 hrs.at home and arrange for someone to drive you home from the              Hospital.              YOUR DRIVER IS:   Special Instructions:             Please read over the following fact sheets that you were given:             1. [facility] PREPARING FOR SURGERY SHEET              2.MRSA INFORMATION              3.[facility]                                        Telford Nab.Denika Krone,RN,BSN     [phone]                FAILURE TO FOLLOW THESE INSTRUCTIONS MAY RESULT IN  CANCELLATION OF YOUR SURGERY!               Patient Signature:___________________________

## 2013-06-22 ENCOUNTER — Other Ambulatory Visit: Payer: Self-pay | Admitting: Orthopedic Surgery

## 2013-06-22 ENCOUNTER — Encounter (HOSPITAL_COMMUNITY)
Admission: RE | Admit: 2013-06-22 | Discharge: 2013-06-22 | Disposition: A | Discharge status: Home / Self Care | Payer: Worker's Compensation | Source: Ambulatory Visit | Attending: Specialist | Admitting: Specialist

## 2013-06-22 NOTE — H&P (Signed)
Paul Dudley is an 47 y.o. male.   Chief Complaint: back and R leg pain HPI: Back and R leg pain s/p work related injury 10/13/12 (8.5 months ago) with continued back and RLE pain (right anterior thigh, anteromedial lower leg, great toe, 2nd toe) with numbness R foot. He still reports pain radiating down the L3-L4 nerve root distribution on the right specifically with certain activities in physical therapy, twisting, rotational maneuvers. He is continuing to perform maintenance physical therapy until his blood sugars are normalized. He was scheduled for surgery in March which was cancelled due to uncontrolled DM. He has seen Dr. Susann Givens and he has been changed to Actoplus. He reports significant reduction in his blood sugars. Symptoms refractory to PT, pain medications, muscle relaxers, relative rest  Past Medical History  Diagnosis Date  . Diabetes mellitus 04/2006  . Hypertension   . Farsightedness     wears glasses    Past Surgical History  Procedure Laterality Date  . Foot surgery      foreign body removal (glass), left foot/  local anesthesia    Family History  Problem Relation Age of Onset  . Cancer Mother     small cell lung cancer  . Diabetes Father   . Blindness Father   . Kidney disease Father     dialysis  . Cancer Brother     died testicular cancer 21 yo  . Cancer Maternal Uncle     small cell lung cancer  . Heart disease Paternal Uncle   . Stroke Maternal Grandmother   . Hypertension Maternal Grandmother    Social History:  reports that he has never smoked. He has never used smokeless tobacco. He reports that he does not drink alcohol or use illicit drugs.  Allergies:  Allergies  Allergen Reactions  . Erythromycin Rash     (Not in a hospital admission)  Results for orders placed during the hospital encounter of 06/22/13 (from the past 48 hour(s))  SURGICAL PCR SCREEN     Status: Abnormal   Collection Time    06/22/13  8:45 AM      Result Value Range   MRSA, PCR INVALID RESULTS, SPECIMEN SENT FOR CULTURE (*) NEGATIVE   Staphylococcus aureus INVALID RESULTS, SPECIMEN SENT FOR CULTURE (*) NEGATIVE   Comment: K DAVIS AT 1050 ON 07.07.2014 BY NBROOKS                The Xpert SA Assay (FDA     approved for NASAL specimens     in patients over 22 years of age),     is one component of     a comprehensive surveillance     program.  Test performance has     been validated by [employer] for patients greater     than or equal to 22 year old.     It is not intended     to diagnose infection nor to     guide or monitor treatment.   No results found.  Review of Systems  Constitutional: Negative.   HENT: Negative.   Eyes: Negative.   Respiratory: Negative.   Cardiovascular: Negative.   Gastrointestinal: Negative.   Genitourinary: Negative.   Musculoskeletal: Positive for back pain.  Skin: Negative.   Neurological: Positive for focal weakness.  Psychiatric/Behavioral: Negative.     There were no vitals taken for this visit. Physical Exam  Constitutional: He is oriented to person, place, and time. He appears well-developed  and well-nourished.  HENT:  Head: Normocephalic and atraumatic.  Eyes: Conjunctivae and EOM are normal. Pupils are equal, round, and reactive to light.  Neck: Normal range of motion. Neck supple.  Cardiovascular: Normal rate.   Respiratory: Effort normal and breath sounds normal.  GI: Soft. Bowel sounds are normal.  Musculoskeletal:  On exam he is upright in minimal distress. Mood and affect appropriate. He continues having straight leg raise and femoral stretch that are positive. Slightly weak quadriceps on the right. Pain with extension.  No evidence of soft tissue swelling, deformity or skin ecchymosis. On palpation there is no tenderness of the lumbar spine. No flank pain with percussion. The abdomen is soft and nontender. Nontender over the trochanters. No cellulitis or lymphadenopathy.  Motor is 5/5  including EHL, tibialis anterior, plantar flexion, hamstrings. Patient is normoreflexic. There is no Babinski or clonus. Sensory exam is intact to light touch. Patient has good distal pulses. No DVT. No pain and normal range of motion without instability of the hips, knees and ankles.   Neurological: He is alert and oriented to person, place, and time. He has normal reflexes.  Skin: Skin is warm and dry.  Psychiatric: He has a normal mood and affect.    His MRI demonstrated a paracentral disc herniation at 3-4, displacing the 4 root.  Assessment/Plan 1. 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 noninsulin dependent diabetes optimized with recent medication change  Given the duration of his symptoms despite conservative treatment in the presence of a neural compressive lesion albeit small, he most likely has some associated adhesions of the root, given the marked neural tension signs. We discussed micro lumbar decompression.  Risks and benefits of this procedure were discussed with the patient including worsening of symptoms, no changes in symptoms, recurrent disc herniation, scar tissue, epidural fibrosis, damage to neurovascular structures, cerebral spinal fluid leak which would require repair or patching, DVT, PE, anesthetic complications, etc. We discussed the perioperative course, the hospitalization, and the need for postoperative rehabilitation and the time estimate for recovery. We also discussed the possibility of future surgery including repeat decompression, fusion. The patient was provided an illustrated handout which was discussed in detail. Appropriate anatomic models were used as well.  I do feel this is associated with his initial injury and is consistent with his initial MRI. I don't feel a concomitant fusion is necessary. We did discuss postoperative back pain related to disc degeneration and the activity modifications necessary for that.  I discussed these issues separately and in front of the patient with Liliane Channel, case manager, reviewing the treatment to date, the medical record, the initial injury and the presence of a persistent neural compressive lesion.  We discussed postoperatively in 2 weeks suture removal, initial P.T. For the first 6 weeks, out of work. Light duty available at that time, converting to work conditioning at 6 weeks, then at 3 months MMI. Continue light duty, no lifting over 10 lbs. Local work only. I do not feel that nonlocal work is reasonable, given the persistent disc herniation and radicular pain. Certainly, following the decompression at 6 weeks, light duty may be an option.  Plan microlumbar decompression L3-4 right  BISSELL, JACLYN M. for Dr. Shelle Iron 06/22/2013, 11:33 AM

## 2013-06-24 ENCOUNTER — Encounter (HOSPITAL_COMMUNITY): Payer: Self-pay | Admitting: *Deleted

## 2013-06-24 ENCOUNTER — Ambulatory Visit (HOSPITAL_COMMUNITY): Payer: Worker's Compensation

## 2013-06-24 ENCOUNTER — Ambulatory Visit (HOSPITAL_COMMUNITY)
Admission: RE | Admit: 2013-06-24 | Discharge: 2013-06-25 | Disposition: A | Discharge status: Home / Self Care | Payer: Worker's Compensation | Source: Ambulatory Visit | Attending: Specialist | Admitting: Specialist

## 2013-06-24 ENCOUNTER — Encounter (HOSPITAL_COMMUNITY)
Admission: RE | Disposition: A | Discharge status: Home / Self Care | Payer: Self-pay | Source: Ambulatory Visit | Attending: Specialist

## 2013-06-24 ENCOUNTER — Ambulatory Visit (HOSPITAL_COMMUNITY): Payer: Worker's Compensation | Admitting: Anesthesiology

## 2013-06-24 ENCOUNTER — Encounter (HOSPITAL_COMMUNITY): Payer: Self-pay | Admitting: Anesthesiology

## 2013-06-24 DIAGNOSIS — I10 Essential (primary) hypertension: Secondary | ICD-10-CM | POA: Insufficient documentation

## 2013-06-24 DIAGNOSIS — M5126 Other intervertebral disc displacement, lumbar region: Secondary | ICD-10-CM

## 2013-06-24 DIAGNOSIS — E119 Type 2 diabetes mellitus without complications: Secondary | ICD-10-CM | POA: Insufficient documentation

## 2013-06-24 DIAGNOSIS — M48061 Spinal stenosis, lumbar region without neurogenic claudication: Secondary | ICD-10-CM | POA: Insufficient documentation

## 2013-06-24 DIAGNOSIS — Z01812 Encounter for preprocedural laboratory examination: Secondary | ICD-10-CM | POA: Insufficient documentation

## 2013-06-24 HISTORY — PX: LUMBAR LAMINECTOMY/DECOMPRESSION MICRODISCECTOMY: SHX5026

## 2013-06-24 LAB — GLUCOSE, CAPILLARY
Glucose-Capillary: 174 mg/dL — ABNORMAL HIGH (ref 70–99)
Glucose-Capillary: 193 mg/dL — ABNORMAL HIGH (ref 70–99)
Glucose-Capillary: 217 mg/dL — ABNORMAL HIGH (ref 70–99)

## 2013-06-24 SURGERY — LUMBAR LAMINECTOMY/DECOMPRESSION MICRODISCECTOMY 1 LEVEL
Anesthesia: General | Laterality: Right | Wound class: Clean

## 2013-06-24 MED ORDER — MENTHOL 3 MG MT LOZG
1.0000 | LOZENGE | OROMUCOSAL | Status: DC | PRN
  Filled 2013-06-24: qty 9

## 2013-06-24 MED ORDER — CEFAZOLIN SODIUM-DEXTROSE 2-3 GM-% IV SOLR
INTRAVENOUS | Status: AC
  Filled 2013-06-24: qty 50

## 2013-06-24 MED ORDER — HYDROMORPHONE HCL PF 1 MG/ML IJ SOLN
INTRAMUSCULAR | Status: DC | PRN
  Administered 2013-06-24 (×3): .4 mg via INTRAVENOUS

## 2013-06-24 MED ORDER — SODIUM CHLORIDE 0.9 % IJ SOLN: 3.0000 mL | Freq: Two times a day (BID) | INTRAMUSCULAR | Status: DC

## 2013-06-24 MED ORDER — SUCCINYLCHOLINE CHLORIDE 20 MG/ML IJ SOLN
INTRAMUSCULAR | Status: DC | PRN
  Administered 2013-06-24: 100 mg via INTRAVENOUS

## 2013-06-24 MED ORDER — CEFAZOLIN SODIUM-DEXTROSE 2-3 GM-% IV SOLR
2.0000 g | INTRAVENOUS | Status: AC
  Administered 2013-06-24: 2 g via INTRAVENOUS

## 2013-06-24 MED ORDER — LISINOPRIL-HYDROCHLOROTHIAZIDE 10-12.5 MG PO TABS: 1.0000 | ORAL_TABLET | Freq: Every day | ORAL | Status: DC

## 2013-06-24 MED ORDER — HYDROMORPHONE HCL PF 1 MG/ML IJ SOLN
INTRAMUSCULAR | Status: AC
  Filled 2013-06-24: qty 1

## 2013-06-24 MED ORDER — METFORMIN HCL 850 MG PO TABS
850.0000 mg | ORAL_TABLET | Freq: Two times a day (BID) | ORAL | Status: DC
  Administered 2013-06-24 – 2013-06-25 (×2): 850 mg via ORAL
  Filled 2013-06-24 (×4): qty 1

## 2013-06-24 MED ORDER — DEXAMETHASONE SODIUM PHOSPHATE 10 MG/ML IJ SOLN
INTRAMUSCULAR | Status: DC | PRN
  Administered 2013-06-24: 8 mg via INTRAVENOUS

## 2013-06-24 MED ORDER — HYDROMORPHONE HCL PF 1 MG/ML IJ SOLN
0.5000 mg | INTRAMUSCULAR | Status: DC | PRN
  Administered 2013-06-24 – 2013-06-25 (×2): 1 mg via INTRAVENOUS
  Filled 2013-06-24 (×2): qty 1

## 2013-06-24 MED ORDER — BUPIVACAINE-EPINEPHRINE (PF) 0.5% -1:200000 IJ SOLN
INTRAMUSCULAR | Status: AC
  Filled 2013-06-24: qty 10

## 2013-06-24 MED ORDER — PIOGLITAZONE HCL-METFORMIN HCL 15-850 MG PO TABS
1.0000 | ORAL_TABLET | Freq: Two times a day (BID) | ORAL | Status: DC
Start: 2013-06-24 — End: 2013-06-24

## 2013-06-24 MED ORDER — ACETAMINOPHEN 325 MG PO TABS: 650.0000 mg | ORAL_TABLET | ORAL | Status: DC | PRN

## 2013-06-24 MED ORDER — HYDROCHLOROTHIAZIDE 12.5 MG PO CAPS
12.5000 mg | ORAL_CAPSULE | Freq: Every day | ORAL | Status: DC
  Administered 2013-06-24 – 2013-06-25 (×2): 12.5 mg via ORAL
  Filled 2013-06-24 (×2): qty 1

## 2013-06-24 MED ORDER — ACETAMINOPHEN 650 MG RE SUPP: 650.0000 mg | RECTAL | Status: DC | PRN

## 2013-06-24 MED ORDER — METHOCARBAMOL 100 MG/ML IJ SOLN
500.0000 mg | Freq: Four times a day (QID) | INTRAVENOUS | Status: DC | PRN
  Administered 2013-06-24: 500 mg via INTRAVENOUS
  Filled 2013-06-24: qty 5

## 2013-06-24 MED ORDER — ACETAMINOPHEN 10 MG/ML IV SOLN
1000.0000 mg | Freq: Once | INTRAVENOUS | Status: AC
  Administered 2013-06-24: 1000 mg via INTRAVENOUS
  Filled 2013-06-24: qty 100

## 2013-06-24 MED ORDER — SODIUM CHLORIDE 0.9 % IV SOLN
INTRAVENOUS | Status: DC | PRN
  Administered 2013-06-24: 08:00:00 via INTRAVENOUS

## 2013-06-24 MED ORDER — SODIUM CHLORIDE 0.9 % IR SOLN
Status: DC | PRN
  Administered 2013-06-24: 09:00:00

## 2013-06-24 MED ORDER — CHLORHEXIDINE GLUCONATE 4 % EX LIQD
60.0000 mL | Freq: Once | CUTANEOUS | Status: DC
  Filled 2013-06-24: qty 60

## 2013-06-24 MED ORDER — LACTATED RINGERS IV SOLN: INTRAVENOUS | Status: DC

## 2013-06-24 MED ORDER — CYCLOBENZAPRINE HCL 10 MG PO TABS: 5.0000 mg | ORAL_TABLET | Freq: Every day | ORAL | Status: DC | PRN

## 2013-06-24 MED ORDER — ONDANSETRON HCL 4 MG/2ML IJ SOLN: 4.0000 mg | INTRAMUSCULAR | Status: DC | PRN

## 2013-06-24 MED ORDER — CEFAZOLIN SODIUM-DEXTROSE 2-3 GM-% IV SOLR
2.0000 g | Freq: Three times a day (TID) | INTRAVENOUS | Status: AC
  Administered 2013-06-24 – 2013-06-25 (×2): 2 g via INTRAVENOUS
  Filled 2013-06-24 (×2): qty 50

## 2013-06-24 MED ORDER — OXYCODONE-ACETAMINOPHEN 7.5-325 MG PO TABS: 1.0000 | ORAL_TABLET | ORAL | Status: DC | PRN

## 2013-06-24 MED ORDER — PIOGLITAZONE HCL 15 MG PO TABS
15.0000 mg | ORAL_TABLET | Freq: Two times a day (BID) | ORAL | Status: DC
  Administered 2013-06-24 – 2013-06-25 (×2): 15 mg via ORAL
  Filled 2013-06-24 (×4): qty 1

## 2013-06-24 MED ORDER — ONDANSETRON HCL 4 MG/2ML IJ SOLN
INTRAMUSCULAR | Status: DC | PRN
  Administered 2013-06-24: 4 mg via INTRAVENOUS

## 2013-06-24 MED ORDER — MIDAZOLAM HCL 5 MG/5ML IJ SOLN
INTRAMUSCULAR | Status: DC | PRN
  Administered 2013-06-24: 2 mg via INTRAVENOUS

## 2013-06-24 MED ORDER — THROMBIN 5000 UNITS EX SOLR
CUTANEOUS | Status: DC | PRN
  Administered 2013-06-24: 10000 [IU] via TOPICAL

## 2013-06-24 MED ORDER — OXYCODONE-ACETAMINOPHEN 5-325 MG PO TABS
1.0000 | ORAL_TABLET | ORAL | Status: DC | PRN
  Administered 2013-06-25: 2 via ORAL
  Filled 2013-06-24: qty 2

## 2013-06-24 MED ORDER — HYDROMORPHONE HCL PF 1 MG/ML IJ SOLN
0.2500 mg | INTRAMUSCULAR | Status: DC | PRN
  Administered 2013-06-24 (×2): 0.5 mg via INTRAVENOUS

## 2013-06-24 MED ORDER — CISATRACURIUM BESYLATE (PF) 10 MG/5ML IV SOLN
INTRAVENOUS | Status: DC | PRN
  Administered 2013-06-24: 4 mg via INTRAVENOUS
  Administered 2013-06-24: 2 mg via INTRAVENOUS

## 2013-06-24 MED ORDER — PHENYLEPHRINE HCL 10 MG/ML IJ SOLN
INTRAMUSCULAR | Status: DC | PRN
  Administered 2013-06-24 (×3): 40 ug via INTRAVENOUS

## 2013-06-24 MED ORDER — MEPERIDINE HCL 50 MG/ML IJ SOLN: 6.2500 mg | INTRAMUSCULAR | Status: DC | PRN

## 2013-06-24 MED ORDER — GLYCOPYRROLATE 0.2 MG/ML IJ SOLN
INTRAMUSCULAR | Status: DC | PRN
  Administered 2013-06-24: .6 mg via INTRAVENOUS

## 2013-06-24 MED ORDER — SUFENTANIL CITRATE 50 MCG/ML IV SOLN
INTRAVENOUS | Status: DC | PRN
  Administered 2013-06-24: 10 ug via INTRAVENOUS
  Administered 2013-06-24: 20 ug via INTRAVENOUS

## 2013-06-24 MED ORDER — THROMBIN 5000 UNITS EX SOLR
CUTANEOUS | Status: AC
  Filled 2013-06-24: qty 10000

## 2013-06-24 MED ORDER — NEOSTIGMINE METHYLSULFATE 1 MG/ML IJ SOLN
INTRAMUSCULAR | Status: DC | PRN
  Administered 2013-06-24: 4 mg via INTRAVENOUS

## 2013-06-24 MED ORDER — PROPOFOL 10 MG/ML IV BOLUS
INTRAVENOUS | Status: DC | PRN
  Administered 2013-06-24: 160 mg via INTRAVENOUS

## 2013-06-24 MED ORDER — LIDOCAINE HCL (CARDIAC) 20 MG/ML IV SOLN
INTRAVENOUS | Status: DC | PRN
  Administered 2013-06-24: 80 mg via INTRAVENOUS

## 2013-06-24 MED ORDER — SODIUM CHLORIDE 0.45 % IV SOLN
INTRAVENOUS | Status: DC
  Administered 2013-06-24: 75 mL via INTRAVENOUS

## 2013-06-24 MED ORDER — BUPIVACAINE-EPINEPHRINE 0.5% -1:200000 IJ SOLN
INTRAMUSCULAR | Status: DC | PRN
  Administered 2013-06-24: 8 mL

## 2013-06-24 MED ORDER — SODIUM CHLORIDE 0.9 % IJ SOLN: 3.0000 mL | INTRAMUSCULAR | Status: DC | PRN

## 2013-06-24 MED ORDER — PROMETHAZINE HCL 25 MG/ML IJ SOLN: 6.2500 mg | INTRAMUSCULAR | Status: DC | PRN

## 2013-06-24 MED ORDER — METHOCARBAMOL 500 MG PO TABS: 500.0000 mg | ORAL_TABLET | Freq: Three times a day (TID) | ORAL | Status: DC

## 2013-06-24 MED ORDER — LISINOPRIL 10 MG PO TABS
10.0000 mg | ORAL_TABLET | Freq: Every day | ORAL | Status: DC
  Administered 2013-06-24 – 2013-06-25 (×2): 10 mg via ORAL
  Filled 2013-06-24 (×2): qty 1

## 2013-06-24 MED ORDER — GLIPIZIDE 5 MG PO TABS
5.0000 mg | ORAL_TABLET | Freq: Two times a day (BID) | ORAL | Status: DC
  Administered 2013-06-24 – 2013-06-25 (×2): 5 mg via ORAL
  Filled 2013-06-24 (×4): qty 1

## 2013-06-24 MED ORDER — SODIUM CHLORIDE 0.9 % IV SOLN: 250.0000 mL | INTRAVENOUS | Status: DC

## 2013-06-24 MED ORDER — DOCUSATE SODIUM 100 MG PO CAPS
100.0000 mg | ORAL_CAPSULE | Freq: Two times a day (BID) | ORAL | Status: DC
  Administered 2013-06-24 – 2013-06-25 (×2): 100 mg via ORAL

## 2013-06-24 MED ORDER — METHOCARBAMOL 500 MG PO TABS
500.0000 mg | ORAL_TABLET | Freq: Four times a day (QID) | ORAL | Status: DC | PRN
  Administered 2013-06-25 (×2): 500 mg via ORAL
  Filled 2013-06-24 (×2): qty 1

## 2013-06-24 MED ORDER — INSULIN ASPART 100 UNIT/ML ~~LOC~~ SOLN
0.0000 [IU] | Freq: Three times a day (TID) | SUBCUTANEOUS | Status: DC
  Administered 2013-06-24: 3 [IU] via SUBCUTANEOUS
  Administered 2013-06-24: 5 [IU] via SUBCUTANEOUS
  Administered 2013-06-25 (×2): 3 [IU] via SUBCUTANEOUS

## 2013-06-24 MED ORDER — PHENOL 1.4 % MT LIQD: 1.0000 | OROMUCOSAL | Status: DC | PRN

## 2013-06-24 SURGICAL SUPPLY — 50 items
BAG ZIPLOCK 12X15 (MISCELLANEOUS) ×2 IMPLANT
BENZOIN TINCTURE PRP APPL 2/3 (GAUZE/BANDAGES/DRESSINGS) ×4 IMPLANT
CHLORAPREP W/TINT 26ML (MISCELLANEOUS) IMPLANT
CLEANER TIP ELECTROSURG 2X2 (MISCELLANEOUS) ×2 IMPLANT
CLOTH 2% CHLOROHEXIDINE 3PK (PERSONAL CARE ITEMS) ×2 IMPLANT
CLOTH BEACON ORANGE TIMEOUT ST (SAFETY) ×2 IMPLANT
DECANTER SPIKE VIAL GLASS SM (MISCELLANEOUS) ×2 IMPLANT
DRAPE MICROSCOPE LEICA (MISCELLANEOUS) ×2 IMPLANT
DRAPE POUCH INSTRU U-SHP 10X18 (DRAPES) ×2 IMPLANT
DRAPE SURG 17X11 SM STRL (DRAPES) ×2 IMPLANT
DRSG AQUACEL AG ADV 3.5X 4 (GAUZE/BANDAGES/DRESSINGS) ×2 IMPLANT
DRSG EMULSION OIL 3X3 NADH (GAUZE/BANDAGES/DRESSINGS) IMPLANT
DRSG PAD ABDOMINAL 8X10 ST (GAUZE/BANDAGES/DRESSINGS) IMPLANT
DRSG TELFA 4X5 ISLAND ADH (GAUZE/BANDAGES/DRESSINGS) IMPLANT
DURAFORM SPONGE 2X2 SINGLE (Neuro Prosthesis/Implant) ×2 IMPLANT
DURAPREP 26ML APPLICATOR (WOUND CARE) ×2 IMPLANT
ELECT REM PT RETURN 9FT ADLT (ELECTROSURGICAL) ×2
ELECTRODE REM PT RTRN 9FT ADLT (ELECTROSURGICAL) ×1 IMPLANT
GLOVE BIOGEL PI IND STRL 7.5 (GLOVE) ×1 IMPLANT
GLOVE BIOGEL PI IND STRL 8 (GLOVE) ×1 IMPLANT
GLOVE BIOGEL PI INDICATOR 7.5 (GLOVE) ×1
GLOVE BIOGEL PI INDICATOR 8 (GLOVE) ×1
GLOVE SURG SS PI 7.5 STRL IVOR (GLOVE) ×2 IMPLANT
GLOVE SURG SS PI 8.0 STRL IVOR (GLOVE) ×4 IMPLANT
GOWN PREVENTION PLUS LG XLONG (DISPOSABLE) ×2 IMPLANT
GOWN STRL REIN XL XLG (GOWN DISPOSABLE) ×4 IMPLANT
KIT BASIN OR (CUSTOM PROCEDURE TRAY) ×2 IMPLANT
KIT POSITIONING SURG ANDREWS (MISCELLANEOUS) ×2 IMPLANT
MANIFOLD NEPTUNE II (INSTRUMENTS) ×2 IMPLANT
NEEDLE SPNL 18GX3.5 QUINCKE PK (NEEDLE) ×6 IMPLANT
PATTIES SURGICAL .5 X.5 (GAUZE/BANDAGES/DRESSINGS) IMPLANT
PATTIES SURGICAL .75X.75 (GAUZE/BANDAGES/DRESSINGS) IMPLANT
PATTIES SURGICAL 1X1 (DISPOSABLE) IMPLANT
SPONGE SURGIFOAM ABS GEL 100 (HEMOSTASIS) ×2 IMPLANT
STAPLER VISISTAT (STAPLE) IMPLANT
STRIP CLOSURE SKIN 1/2X4 (GAUZE/BANDAGES/DRESSINGS) ×2 IMPLANT
SUT PROLENE 3 0 PS 2 (SUTURE) ×2 IMPLANT
SUT VIC AB 0 CT1 27 (SUTURE)
SUT VIC AB 0 CT1 27XBRD ANTBC (SUTURE) IMPLANT
SUT VIC AB 1 CT1 27 (SUTURE)
SUT VIC AB 1 CT1 27XBRD ANTBC (SUTURE) IMPLANT
SUT VIC AB 1-0 CT2 27 (SUTURE) IMPLANT
SUT VIC AB 2-0 CT1 27 (SUTURE)
SUT VIC AB 2-0 CT1 TAPERPNT 27 (SUTURE) IMPLANT
SUT VIC AB 2-0 CT2 27 (SUTURE) ×2 IMPLANT
SUT VICRYL 0 27 CT2 27 ABS (SUTURE) ×4 IMPLANT
SUT VICRYL 0 UR6 27IN ABS (SUTURE) IMPLANT
SYRINGE 10CC LL (SYRINGE) ×4 IMPLANT
TRAY LAMINECTOMY (CUSTOM PROCEDURE TRAY) ×2 IMPLANT
YANKAUER SUCT BULB TIP NO VENT (SUCTIONS) ×2 IMPLANT

## 2013-06-24 NOTE — Anesthesia Postprocedure Evaluation (Signed)
  Anesthesia Post-op Note  Patient: Paul Dudley  Procedure(s) Performed: Procedure(s) (LRB): MICRODISCECTOMY L3 - L4 ON THE RIGHT  1 LEVEL (Right)  Patient Location: PACU  Anesthesia Type: General  Level of Consciousness: awake and alert   Airway and Oxygen Therapy: Patient Spontanous Breathing  Post-op Pain: mild  Post-op Assessment: Post-op Vital signs reviewed, Patient's Cardiovascular Status Stable, Respiratory Function Stable, Patent Airway and No signs of Nausea or vomiting  Last Vitals:  Filed Vitals:   06/24/13 1546  BP: 125/67  Pulse: 80  Temp: 36.9 C  Resp: 16    Post-op Vital Signs: stable   Complications: No apparent anesthesia complications

## 2013-06-24 NOTE — Progress Notes (Signed)
Dr. Acey Lav made aware of patient's CBG results in PACU- 193

## 2013-06-24 NOTE — Transfer of Care (Signed)
Immediate Anesthesia Transfer of Care Note  Patient: Paul Dudley  Procedure(s) Performed: Procedure(s): MICRODISCECTOMY L3 - L4 ON THE RIGHT  1 LEVEL (Right)  Patient Location: PACU  Anesthesia Type:General  Level of Consciousness: awake, alert  and oriented  Airway & Oxygen Therapy: Patient Spontanous Breathing and Patient connected to face mask oxygen  Post-op Assessment: Report given to PACU RN and Post -op Vital signs reviewed and stable  Post vital signs: Reviewed and stable  Complications: No apparent anesthesia complications

## 2013-06-24 NOTE — Anesthesia Preprocedure Evaluation (Addendum)
Anesthesia Evaluation  Patient identified by MRN, date of birth, ID band Patient awake    Reviewed: Allergy & Precautions, H&P , NPO status , Patient's Chart, lab work & pertinent test results  Airway Mallampati: II TM Distance: >3 FB Neck ROM: Full    Dental no notable dental hx.    Pulmonary neg pulmonary ROS,  breath sounds clear to auscultation  Pulmonary exam normal       Cardiovascular hypertension, Pt. on medications Rhythm:Regular Rate:Normal     Neuro/Psych negative neurological ROS  negative psych ROS   GI/Hepatic negative GI ROS, Neg liver ROS,   Endo/Other  diabetes, Type 2  Renal/GU negative Renal ROS  negative genitourinary   Musculoskeletal negative musculoskeletal ROS (+)   Abdominal   Peds negative pediatric ROS (+)  Hematology negative hematology ROS (+)   Anesthesia Other Findings   Reproductive/Obstetrics negative OB ROS                          Anesthesia Physical Anesthesia Plan  ASA: II  Anesthesia Plan: General   Post-op Pain Management:    Induction: Intravenous  Airway Management Planned: Oral ETT  Additional Equipment:   Intra-op Plan:   Post-operative Plan: Extubation in OR  Informed Consent: I have reviewed the patients History and Physical, chart, labs and discussed the procedure including the risks, benefits and alternatives for the proposed anesthesia with the patient or authorized representative who has indicated his/her understanding and acceptance.   Dental advisory given  Plan Discussed with: CRNA  Anesthesia Plan Comments:         Anesthesia Quick Evaluation

## 2013-06-24 NOTE — Preoperative (Signed)
Beta Blockers   Reason not to administer Beta Blockers:Not Applicable 

## 2013-06-24 NOTE — Interval H&P Note (Signed)
History and Physical Interval Note:  06/24/2013 8:36 AM  Paul Dudley  has presented today for surgery, with the diagnosis of HNP L3 - L4  The various methods of treatment have been discussed with the patient and family. After consideration of risks, benefits and other options for treatment, the patient has consented to  Procedure(s): MICRODISCECTOMY L3 - L4 ON THE RIGHT  1 LEVEL (Right) as a surgical intervention .  The patient's history has been reviewed, patient examined, no change in status, stable for surgery.  I have reviewed the patient's chart and labs.  Questions were answered to the patient's satisfaction.     [name] C

## 2013-06-24 NOTE — Evaluation (Signed)
Physical Therapy Evaluation Patient Details Name: Paul Dudley MRN: [id number] DOB: 09-24-1966 Today's Date: 06/24/2013 Time: 4132-4401 PT Time Calculation (min): 20 min  PT Assessment / Plan / Recommendation History of Present Illness     Clinical Impression  Pt s/p L3-4 microdiscectomy presents with functional mobility limitations 2* post op pain and back precautions.  Pt will benefit from acute stay PT services to maximize IND with all mobility tasks for return home with family assist.    PT Assessment  Patient needs continued PT services    Follow Up Recommendations  No PT follow up    Does the patient have the potential to tolerate intense rehabilitation      Barriers to Discharge        Equipment Recommendations  Rolling walker with 5" wheels    Recommendations for Other Services OT consult   Frequency Min 6X/week    Precautions / Restrictions Precautions Precautions: Back Restrictions Weight Bearing Restrictions: No   Pertinent Vitals/Pain 7/10; premed,       Mobility  Bed Mobility Bed Mobility: Supine to Sit Supine to Sit: 4: Min assist;3: Mod assist Details for Bed Mobility Assistance: cues for correct log roll technique  Transfers Transfers: Sit to Stand;Stand to Sit Sit to Stand: 4: Min assist Stand to Sit: 4: Min assist Details for Transfer Assistance: cues for transition position, use of UEs to self assist and minimizing fwd flex Ambulation/Gait Ambulation/Gait Assistance: 4: Min assist Ambulation Distance (Feet): 98 Feet Assistive device: Rolling walker Ambulation/Gait Assistance Details: cues for posture and position from RW Gait Pattern: Step-through pattern;Decreased step length - left;Decreased step length - right Stairs: No    Exercises     PT Diagnosis: Difficulty walking  PT Problem List: Decreased activity tolerance;Decreased balance;Decreased mobility;Decreased knowledge of use of DME;Decreased knowledge of precautions;Pain PT  Treatment Interventions: DME instruction;Gait training;Stair training;Functional mobility training;Therapeutic activities;Patient/family education     PT Goals(Current goals can be found in the care plan section) Acute Rehab PT Goals Patient Stated Goal: Resume previous lifestyle with decreased pain PT Goal Formulation: With patient Time For Goal Achievement: 07/01/13 Potential to Achieve Goals: Good  Visit Information  Last PT Received On: 06/24/13 Assistance Needed: +1       Prior Functioning  Home Living Family/patient expects to be discharged to:: Private residence Living Arrangements: Spouse/significant other Available Help at Discharge: Family Type of Home: House Home Access: Stairs to enter Secretary/administrator of Steps: 1 Entrance Stairs-Rails: None Home Layout: Two level Alternate Level Stairs-Number of Steps: 14 Alternate Level Stairs-Rails: Right Home Equipment: None Prior Function Level of Independence: Independent Communication Communication: No difficulties Dominant Hand: Right    Cognition  Cognition Arousal/Alertness: Awake/alert Behavior During Therapy: WFL for tasks assessed/performed Overall Cognitive Status: Within Functional Limits for tasks assessed    Extremity/Trunk Assessment Upper Extremity Assessment Upper Extremity Assessment: Overall WFL for tasks assessed Lower Extremity Assessment Lower Extremity Assessment: Overall WFL for tasks assessed   Balance    End of Session PT - End of Session Activity Tolerance: Patient tolerated treatment well;Patient limited by pain Patient left: in chair;with call bell/phone within reach;with family/visitor present Nurse Communication: Mobility status  GP Functional Assessment Tool Used: Clinical judgement Functional Limitation: Mobility: Walking and moving around Mobility: Walking and Moving Around Current Status ([id number]): At least 20 percent but less than 40 percent impaired, limited or  restricted Mobility: Walking and Moving Around Goal Status [phone]): At least 1 percent but less than 20 percent impaired, limited or restricted  Paul Dudley 06/24/2013, 5:09 PM

## 2013-06-24 NOTE — H&P (View-Only) (Signed)
Paul Dudley is an 47 y.o. male.   Chief Complaint: back and R leg pain HPI: Back and R leg pain s/p work related injury 10/13/12 (8.5 months ago) with continued back and RLE pain (right anterior thigh, anteromedial lower leg, great toe, 2nd toe) with numbness R foot. He still reports pain radiating down the L3-L4 nerve root distribution on the right specifically with certain activities in physical therapy, twisting, rotational maneuvers. He is continuing to perform maintenance physical therapy until his blood sugars are normalized. He was scheduled for surgery in March which was cancelled due to uncontrolled DM. He has seen Dr. LaLonde and he has been changed to Actoplus. He reports significant reduction in his blood sugars. Symptoms refractory to PT, pain medications, muscle relaxers, relative rest  Past Medical History  Diagnosis Date  . Diabetes mellitus 04/2006  . Hypertension   . Farsightedness     wears glasses    Past Surgical History  Procedure Laterality Date  . Foot surgery      foreign body removal (glass), left foot/  local anesthesia    Family History  Problem Relation Age of Onset  . Cancer Mother     small cell lung cancer  . Diabetes Father   . Blindness Father   . Kidney disease Father     dialysis  . Cancer Brother     died testicular cancer 38 yo  . Cancer Maternal Uncle     small cell lung cancer  . Heart disease Paternal Uncle   . Stroke Maternal Grandmother   . Hypertension Maternal Grandmother    Social History:  reports that he has never smoked. He has never used smokeless tobacco. He reports that he does not drink alcohol or use illicit drugs.  Allergies:  Allergies  Allergen Reactions  . Erythromycin Rash     (Not in a hospital admission)  Results for orders placed during the hospital encounter of 06/22/13 (from the past 48 hour(s))  SURGICAL PCR SCREEN     Status: Abnormal   Collection Time    06/22/13  8:45 AM      Result Value Range   MRSA, PCR INVALID RESULTS, SPECIMEN SENT FOR CULTURE (*) NEGATIVE   Staphylococcus aureus INVALID RESULTS, SPECIMEN SENT FOR CULTURE (*) NEGATIVE   Comment: K DAVIS AT 1050 ON 07.07.2014 BY NBROOKS                The Xpert SA Assay (FDA     approved for NASAL specimens     in patients over 21 years of age),     is one component of     a comprehensive surveillance     program.  Test performance has     been validated by [facility] for patients greater     than or equal to 1 year old.     It is not intended     to diagnose infection nor to     guide or monitor treatment.   No results found.  Review of Systems  Constitutional: Negative.   HENT: Negative.   Eyes: Negative.   Respiratory: Negative.   Cardiovascular: Negative.   Gastrointestinal: Negative.   Genitourinary: Negative.   Musculoskeletal: Positive for back pain.  Skin: Negative.   Neurological: Positive for focal weakness.  Psychiatric/Behavioral: Negative.     There were no vitals taken for this visit. Physical Exam  Constitutional: He is oriented to person, place, and time. He appears well-developed   and well-nourished.  HENT:  Head: Normocephalic and atraumatic.  Eyes: Conjunctivae and EOM are normal. Pupils are equal, round, and reactive to light.  Neck: Normal range of motion. Neck supple.  Cardiovascular: Normal rate.   Respiratory: Effort normal and breath sounds normal.  GI: Soft. Bowel sounds are normal.  Musculoskeletal:  On exam he is upright in minimal distress. Mood and affect appropriate. He continues having straight leg raise and femoral stretch that are positive. Slightly weak quadriceps on the right. Pain with extension.  No evidence of soft tissue swelling, deformity or skin ecchymosis. On palpation there is no tenderness of the lumbar spine. No flank pain with percussion. The abdomen is soft and nontender. Nontender over the trochanters. No cellulitis or lymphadenopathy.  Motor is 5/5  including EHL, tibialis anterior, plantar flexion, hamstrings. Patient is normoreflexic. There is no Babinski or clonus. Sensory exam is intact to light touch. Patient has good distal pulses. No DVT. No pain and normal range of motion without instability of the hips, knees and ankles.   Neurological: He is alert and oriented to person, place, and time. He has normal reflexes.  Skin: Skin is warm and dry.  Psychiatric: He has a normal mood and affect.    His MRI demonstrated a paracentral disc herniation at 3-4, displacing the 4 root.  Assessment/Plan 1. 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 noninsulin dependent diabetes optimized with recent medication change  Given the duration of his symptoms despite conservative treatment in the presence of a neural compressive lesion albeit small, he most likely has some associated adhesions of the root, given the marked neural tension signs. We discussed micro lumbar decompression.  Risks and benefits of this procedure were discussed with the patient including worsening of symptoms, no changes in symptoms, recurrent disc herniation, scar tissue, epidural fibrosis, damage to neurovascular structures, cerebral spinal fluid leak which would require repair or patching, DVT, PE, anesthetic complications, etc. We discussed the perioperative course, the hospitalization, and the need for postoperative rehabilitation and the time estimate for recovery. We also discussed the possibility of future surgery including repeat decompression, fusion. The patient was provided an illustrated handout which was discussed in detail. Appropriate anatomic models were used as well.  I do feel this is associated with his initial injury and is consistent with his initial MRI. I don't feel a concomitant fusion is necessary. We did discuss postoperative back pain related to disc degeneration and the activity modifications necessary for that.  I discussed these issues separately and in front of the patient with Darlene Moore, case manager, reviewing the treatment to date, the medical record, the initial injury and the presence of a persistent neural compressive lesion.  We discussed postoperatively in 2 weeks suture removal, initial P.T. For the first 6 weeks, out of work. Light duty available at that time, converting to work conditioning at 6 weeks, then at 3 months MMI. Continue light duty, no lifting over 10 lbs. Local work only. I do not feel that nonlocal work is reasonable, given the persistent disc herniation and radicular pain. Certainly, following the decompression at 6 weeks, light duty may be an option.  Plan microlumbar decompression L3-4 right  BISSELL, JACLYN M. for Dr. Beane 06/22/2013, 11:33 AM    

## 2013-06-24 NOTE — Brief Op Note (Signed)
06/24/2013  10:13 AM  PATIENT:  Paul Dudley  47 y.o. male  PRE-OPERATIVE DIAGNOSIS:  HNP L3 - L4  POST-OPERATIVE DIAGNOSIS:  HNP L3 - L4  PROCEDURE:  Procedure(s): MICRODISCECTOMY L3 - L4 ON THE RIGHT  1 LEVEL (Right)  SURGEON:  Surgeon(s) and Role:    * Javier Docker, MD   PHYSICIAN ASSISTANT: Bissell  ASSISTANTS:    ANESTHESIA:   general  EBL:  Total I/O In: -  Out: 20 [Blood:20]  BLOOD ADMINISTERED:none  DRAINS: none   LOCAL MEDICATIONS USED:  MARCAINE     SPECIMEN:  No Specimen  DISPOSITION OF SPECIMEN:  N/A  COUNTS:  YES  TOURNIQUET:  * No tourniquets in log *  DICTATION: .Other Dictation: Dictation Number [id number]  PLAN OF CARE: Admit for overnight observation  PATIENT DISPOSITION:  PACU - hemodynamically stable.   Delay start of Pharmacological VTE agent (>24hrs) due to surgical blood loss or risk of bleeding: yes

## 2013-06-25 LAB — BASIC METABOLIC PANEL
BUN: 12 mg/dL (ref 6–23)
CO2: 25 mEq/L (ref 19–32)
Chloride: 100 mEq/L (ref 96–112)
Creatinine, Ser: 0.98 mg/dL (ref 0.50–1.35)
GFR calc Af Amer: >90 mL/min (ref >90)

## 2013-06-25 LAB — MRSA CULTURE

## 2013-06-25 LAB — GLUCOSE, CAPILLARY: Glucose-Capillary: 170 mg/dL — ABNORMAL HIGH (ref 70–99)

## 2013-06-25 NOTE — Progress Notes (Signed)
CSW consulted for SNF placement. PN reviewed. Pt plans to d/c home following hospital d/c. RNCM will assist with d/c planning , if required.  Cori Razor LCSW [phone]

## 2013-06-25 NOTE — Op Note (Signed)
NAMEDEMONTEZ, NOVACK                ACCOUNT NO.:  [id number]  MEDICAL RECORD NO.:  [id number]  LOCATION:  1621                         FACILITY:  [facility]  PHYSICIAN:  Jene Every, M.D.    DATE OF BIRTH:  1966/10/12  DATE OF PROCEDURE:  06/24/2013 DATE OF DISCHARGE:                              OPERATIVE REPORT   PREOPERATIVE DIAGNOSIS:  Spinal stenosis, herniated nucleus pulposus at L3-4 right.  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  PROCEDURE PERFORMED: 1. Microlumbar decompression L3-L4, foraminotomies of L4-L3. 2. Lysis of epidural venous plexus. 3. Microdiskectomy L3-4, right. 4. Application of DuraGen to attenuate the thecal sac.  ANESTHESIA:  General.  ASSISTANT:  __________.  HISTORY:  This is a 47 year old male with persistent right lower extremity radicular pain, L4 nerve root distribution.  MRI indicating small disk herniation.  He was treated conservatively with epidural steroid injections, physical therapy, activity modification, and had persistent pain.  Recent exacerbation of L4 pain into the medial aspect of the ankle with neural tension signs, slight quad weakness __________ for decompression failing conservative treatment.  Risks and benefits were discussed including bleeding, infection, damage to the vasculature, DVT, PE, anesthetic complications, etc.  TECHNIQUE:  With the patient in supine position, after induction of adequate general anesthesia, 2 g Kefzol placed prone on the [place] frame.  All bony prominences were well padded.  Lumbar region was prepped and draped in usual sterile fashion.  Two 18-gauge spinal needle was utilized to localize the L3-4 interspace, confirmed with x-ray. Incision was made from spinous process of L3-4.  Subcutaneous tissue was dissected.  Electrocautery was utilized to achieve hemostasis. Dorsolumbar  fascia identified and divided in line with skin incision. Paraspinous muscle elevated from lamina 3-4.  McCullough retractor was placed.  Operating microscope was draped on the surgical field. Confirmatory radiograph obtained.  Hemilaminotomy __________ was performed with 3 mm Kerrison, as that was right over the disk space at L3-4.  We preserved the pars, and removed the ligamentum flavum from the interspace.  The lateral aspect the thecal sac at the level of the disk space and exiting out the foramen of 4.  The tethering of the thecal sac from a large epidural venous plexus.  After performing the foraminotomy of L4 and gently mobilized, the thecal sac medially, we cauterized the vascular leash and divided it.  It was a focal HNP just cephalad to that adhesive thecal sac.  Remnants of previous injection was noted as well. There was a small area about 2 mm on the ventral side of the thecal sac that showed attenuation of the thecal sac.  There was no CSF leakage through Valsalva.  I made a small annulotomy in the disk space, removed small disk rupture with a micropituitary.  I copiously irrigated the disk space with antibiotic irrigation.  Inspection revealed no evidence of CSF leakage or active bleeding.  We had 1 cm __________ medial pedicle without tension.  I placed a small DuraGen patch on this area of attenuated sac that was measuring 2 mm in length, and then thrombin- soaked Gelfoam upon that.  I removed the McCullough retractor.  No evidence of CSF leakage or active bleeding.  I repaired the dorsolumbar fascia with 1 Vicryl figure-of-eight sutures, subcu with 2-0 Vicryl simple sutures, and skin was reapproximated with 4-0 subcuticular Prolene.  Wound reinforced with Steri-Strips.  Sterile dressing applied. Placed supine on hospital bed, extubated without difficulty, and transported to the recovery room in satisfactory condition.  The patient tolerated the procedure well.  No  complications.  Minimal blood loss.     Jene Every, M.D.     Cordelia Pen  D:  06/24/2013  T:  06/24/2013  Job:  [id number]

## 2013-06-25 NOTE — Progress Notes (Signed)
Physical Therapy Treatment Patient Details Name: Braxson Hollingsworth MRN: [id number] DOB: 10/02/1966 Today's Date: 06/25/2013 Time:  - 10:09 - 10:48  39 min  PT Assessment / Plan / Recommendation  PT Comments   Spent much time addressing back percautions and pt required increased time to complete tasks 2nd pain.  Amb in hallway.  Practiced stairs using one crutch and one rail.  Issued pt a back booklet and addressed sleeping positions and "Log Rolling" tech.  Discussed use of ICE and activity level when home.  Issued HEP the was included in booklet.   Follow Up Recommendations  No PT follow up     Does the patient have the potential to tolerate intense rehabilitation     Barriers to Discharge        Equipment Recommendations  Rolling walker with 5" wheels    Recommendations for Other Services    Frequency Min 6X/week   Progress towards PT Goals Progress towards PT goals: Progressing toward goals  Plan      Precautions / Restrictions Precautions Precautions: Back Precaution Comments: Pt recalls 3/3 back percautions and lifting restriction.  Issued back care booklet. Restrictions Weight Bearing Restrictions: No    Pertinent Vitals/Pain C/o 8/10 back pain with act ICE applied Pre medicated    Mobility  Bed Mobility Bed Mobility: Left Sidelying to Sit Left Sidelying to Sit: 5: Supervision Details for Bed Mobility Assistance: pt demon proper Log Roll tech with no VC's Transfers Transfers: Sit to Stand;Stand to Sit Sit to Stand: 5: Supervision;4: Min guard;From bed Stand to Sit: 5: Supervision;4: Min guard;To chair/3-in-1 Details for Transfer Assistance: required increased time Ambulation/Gait Ambulation/Gait Assistance: 5: Supervision Ambulation Distance (Feet): 45 Feet Assistive device: Rolling walker Ambulation/Gait Assistance Details: increased time with one VC on safety with turns Gait Pattern: Step-through pattern;Decreased step length - left;Decreased step length -  right Stairs: Yes Stairs Assistance: 4: Min guard Stairs Assistance Details (indicate cue type and reason): 50% VC's on proper sequencing and use of crutch with one R rail. Stair Management Technique: One rail Right;With crutches Number of Stairs: 4     PT Goals (current goals can now be found in the care plan section)    Visit Information  Last PT Received On: 06/25/13 Assistance Needed: +1    Subjective Data      Cognition       Balance     End of Session PT - End of Session Equipment Utilized During Treatment: Gait belt Activity Tolerance: Patient tolerated treatment well;Patient limited by pain Patient left: in chair;with call bell/phone within reach;with family/visitor present   Felecia Shelling  PTA [facility]  Acute  Rehab Pager      [phone]

## 2013-06-25 NOTE — Discharge Summary (Signed)
Physician Discharge Summary   Patient ID: Paul Dudley MRN: [id number] DOB/AGE: [birth year] 47 y.o.  Admit date: 06/24/2013 Discharge date: 06/25/2013  Primary Diagnosis:   HNP L3 - L4  Admission Diagnoses:  Past Medical History  Diagnosis Date  . Diabetes mellitus 04/2006  . Hypertension   . Farsightedness     wears glasses   Discharge Diagnoses:   Principal Problem:   HNP (herniated nucleus pulposus), lumbar  Procedure:  Procedure(s) (LRB): MICRODISCECTOMY L3 - L4 ON THE RIGHT  1 LEVEL (Right)   Consults: None  HPI:  see H&P    Laboratory Data: Hospital Outpatient Visit on 06/22/2013  Component Date Value Range Status  . MRSA, PCR 06/22/2013 INVALID RESULTS, SPECIMEN SENT FOR CULTURE* NEGATIVE Final  . Staphylococcus aureus 06/22/2013 INVALID RESULTS, SPECIMEN SENT FOR CULTURE* NEGATIVE Final   Comment: K DAVIS AT 1050 ON 07.07.2014 BY NBROOKS                                                          The Xpert SA Assay (FDA                          approved for NASAL specimens                          in patients over 44 years of age),                          is one component of                          a comprehensive surveillance                          program.  Test performance has                          been validated by [employer] for patients greater                          than or equal to 60 year old.                          It is not intended                          to diagnose infection nor to                          guide or monitor treatment.  Marland Kitchen Specimen Description 06/22/2013 NOSE   Final  . Special Requests 06/22/2013 NONE   Final  . Culture 06/22/2013 NO SUSPICIOUS COLONIES, CONTINUING TO HOLD   Final  . Report Status 06/22/2013 PENDING   Incomplete   No results found for this basename: HGB,  in the last 72 hours No results found for this  basename: WBC, RBC, HCT, PLT,  in the last 72 hours  Recent Labs  06/25/13 0413  NA 137  K 3.7  CL 100  CO2 25  BUN 12  CREATININE 0.98  GLUCOSE 129*  CALCIUM 9.4   No results found for this basename: LABPT, INR,  in the last 72 hours  X-Rays:Dg Lumbar Spine 2-3 Views  06/18/2013   *RADIOLOGY REPORT*  Clinical Data: Preoperative evaluation for microdiskectomy.  Low back pain.  LUMBAR SPINE - 2-3 VIEW  Comparison: Lumbar spine radiographs 03/03/2013.  Findings: No acute displaced fractures or compression type fractures are noted.  Alignment is anatomic.  Minimal multilevel degenerative disc disease and facet arthropathy is noted.  IMPRESSION: 1.  No acute radiographic abnormality of the lumbar spine.   Original Report Authenticated By: Trudie Reed, M.D.   Dg Spine Portable 1 View  06/24/2013   *RADIOLOGY REPORT*  Clinical Data: Back pain  PORTABLE SPINE - 1 VIEW  Comparison: Multiple priors.  Findings: A slightly angled probe from a posterior approach lies with its tip directly opposite the L3-L4 interspace.  IMPRESSION: L3-L4 marked.   Original Report Authenticated By: Davonna Belling, M.D.   Dg Spine Portable 1 View  06/24/2013   *RADIOLOGY REPORT*  Clinical Data: Intraoperative localization.  PORTABLE SPINE - 1 VIEW  Comparison: 06/24/2013.  Findings: A single intraoperative cross-table lateral view of the lumbar spine is submitted.  Numbering system utilized on the comparison exam is preserved.  Surgical instrument tip projects posterior to the L3 vertebral body.  IMPRESSION: Intraoperative localization, as above.   Original Report Authenticated By: Leanna Battles, M.D.   Dg Spine Portable 1 View  06/24/2013   *RADIOLOGY REPORT*  Clinical Data: Intraoperative radiograph.  PORTABLE SPINE - 1 VIEW  Comparison: MRI 10/23/2012.  Findings: Two needles have been inserted from a posterior approach. The lower needle lies adjacent to the spinous process of L5.  The upper needle lies between the L3 and L4 spinous processes.  IMPRESSION: As above.   Original Report  Authenticated By: Davonna Belling, M.D.    EKG: Orders placed during the hospital encounter of 03/03/13  . EKG 12-LEAD  . EKG 12-LEAD  . EKG     Hospital Course: Patient was admitted to [facility] and taken to the OR and underwent the above state procedure without complications.  Patient tolerated the procedure well and was later transferred to the recovery room and then to the orthopaedic floor for postoperative care.  They were given PO and IV analgesics for pain control following their surgery.  They were given 24 hours of postoperative antibiotics.   PT was consulted postop to assist with mobility and transfers.  The patient was allowed to be WBAT with therapy and was taught back precautions. Discharge planning was consulted to help with postop disposition and equipment needs.  Patient had a fair night on the evening of surgery and started to get up OOB with therapy on day one. Patient was seen in rounds and was ready to go home on day one.  They were given discharge instructions and dressing directions.  They were instructed on when to follow up in the office with Dr. Shelle Iron.  Discharge Medications: Prior to Admission medications   Medication Sig Start Date End Date Taking? Authorizing Provider  glipiZIDE (GLUCOTROL) 5 MG tablet Take 1 tablet (5 mg total) by mouth 2 (two) times daily before a meal. 06/09/13  Yes Ronnald Nian, MD  lisinopril-hydrochlorothiazide (PRINZIDE,ZESTORETIC) 10-12.5 MG per tablet  Take 1 tablet by mouth daily. 06/09/13  Yes Ronnald Nian, MD  pioglitazone-metformin (ACTOPLUS MET) [phone] MG per tablet Take 1 tablet by mouth 2 (two) times daily with a meal. 06/09/13  Yes Ronnald Nian, MD  atorvastatin (LIPITOR) 20 MG tablet Take 20 mg by mouth at bedtime.    Historical Provider, MD  methocarbamol (ROBAXIN) 500 MG tablet Take 1 tablet (500 mg total) by mouth 3 (three) times daily. 06/24/13   Javier Docker, MD  oxyCODONE-acetaminophen (PERCOCET) 7.5-325 MG per  tablet Take 1 tablet by mouth every 4 (four) hours as needed for pain. 06/24/13   Javier Docker, MD    Diet: Diabetic diet Activity:WBAT Follow-up:in 10-14 days Disposition - Home Discharged Condition: good   Discharge Orders   Future Appointments Provider Department Dept Phone   10/13/2013 9:45 AM Ronnald Nian, MD [facility] [phone]   Future Orders Complete By Expires     Call MD / Call 911  As directed     Comments:      If you experience chest pain or shortness of breath, CALL 911 and be transported to the hospital emergency room.  If you develope a fever above 101 F, pus (white drainage) or increased drainage or redness at the wound, or calf pain, call your surgeon's office.    Constipation Prevention  As directed     Comments:      Drink plenty of fluids.  Prune juice may be helpful.  You may use a stool softener, such as Colace (over the counter) 100 mg twice a day.  Use MiraLax (over the counter) for constipation as needed.    Diet - low sodium heart healthy  As directed     Increase activity slowly as tolerated  As directed         Medication List    STOP taking these medications       cyclobenzaprine 5 MG tablet  Commonly known as:  FLEXERIL     oxyCODONE-acetaminophen 5-325 MG per tablet  Commonly known as:  PERCOCET/ROXICET  Replaced by:  oxyCODONE-acetaminophen 7.5-325 MG per tablet      TAKE these medications       atorvastatin 20 MG tablet  Commonly known as:  LIPITOR  Take 20 mg by mouth at bedtime.     glipiZIDE 5 MG tablet  Commonly known as:  GLUCOTROL  Take 1 tablet (5 mg total) by mouth 2 (two) times daily before a meal.     lisinopril-hydrochlorothiazide 10-12.5 MG per tablet  Commonly known as:  PRINZIDE,ZESTORETIC  Take 1 tablet by mouth daily.     methocarbamol 500 MG tablet  Commonly known as:  ROBAXIN  Take 1 tablet (500 mg total) by mouth 3 (three) times daily.     oxyCODONE-acetaminophen 7.5-325 MG per tablet    Commonly known as:  PERCOCET  Take 1 tablet by mouth every 4 (four) hours as needed for pain.     pioglitazone-metformin 15-850 MG per tablet  Commonly known as:  ACTOPLUS MET  Take 1 tablet by mouth 2 (two) times daily with a meal.           Follow-up Information   Follow up with BEANE,JEFFREY C, MD In 2 weeks.   Contact information:   [address] [place] [address] [phone]       Signed: Dorothy Spark. 06/25/2013, 7:40 AM

## 2013-06-25 NOTE — Progress Notes (Addendum)
Subjective: 1 Day Post-Op Procedure(s) (LRB): MICRODISCECTOMY L3 - L4 ON THE RIGHT  1 LEVEL (Right) Patient reports pain as moderate.  Reports incisional back pain, which was worse when OOB yesterday. He was not able to walk much in PT due to pain. His leg pain and numbness is resolved. He removed his IV on his own because he was worried about some swelling there. Voiding without difficulty.  Objective: Vital signs in last 24 hours: Temp:  [97.7 F (36.5 C)-99.4 F (37.4 C)] 98.5 F (36.9 C) (07/10 0455) Pulse Rate:  [77-101] 77 (07/10 0455) Resp:  [9-18] 14 (07/10 0455) BP: (99-140)/(63-77) 99/63 mmHg (07/10 0455) SpO2:  [99 %-100 %] 99 % (07/10 0455) FiO2 (%):  [100 %] 100 % (07/09 1200) Weight:  [76.658 kg (169 lb)] 76.658 kg (169 lb) (07/09 1200)  Intake/Output from previous day: 07/09 0701 - 07/10 0700 In: 2045 [P.O.:840; I.V.:1150; IV Piggyback:55] Out: 2980 [Urine:2960; Blood:20] Intake/Output this shift:    No results found for this basename: HGB,  in the last 72 hours No results found for this basename: WBC, RBC, HCT, PLT,  in the last 72 hours  Recent Labs  06/25/13 0413  NA 137  K 3.7  CL 100  CO2 25  BUN 12  CREATININE 0.98  GLUCOSE 129*  CALCIUM 9.4   No results found for this basename: LABPT, INR,  in the last 72 hours  Neurologically intact ABD soft Neurovascular intact Sensation intact distally Intact pulses distally Dorsiflexion/Plantar flexion intact Incision: dressing C/D/I and no drainage No cellulitis present Compartment soft No calf pain or sign of DVT  Assessment/Plan: 1 Day Post-Op Procedure(s) (LRB): MICRODISCECTOMY L3 - L4 ON THE RIGHT  1 LEVEL (Right) Advance diet Up with therapy D/C IV fluids Discussed D/C instructions Plan D/C after PT today as long as pain is well controlled Glycemic control Discussed with Dr. Elissa Lovett, Dayna Barker. 06/25/2013, 7:35 AM

## 2013-06-25 NOTE — Evaluation (Signed)
Occupational Therapy Evaluation Patient Details Name: Paul Dudley MRN: [id number] DOB: May 21, 1966 Today's Date: 06/25/2013 Time: 1478-2956 OT Time Calculation (min): 22 min  OT Assessment / Plan / Recommendation History of present illness Pt is s/p back surgery.   Clinical Impression   Pt is s/p back surgery. All education completed for ADL/functional transfers. Wife present.     OT Assessment  Patient does not need any further OT services    Follow Up Recommendations  No OT follow up;Supervision/Assistance - 24 hour    Barriers to Discharge      Equipment Recommendations  None recommended by OT    Recommendations for Other Services    Frequency       Precautions / Restrictions Precautions Precautions: Back Precaution Comments: Issued back care handout and reviewed. Pt could state 2/3 precautions on his own./ Restrictions Weight Bearing Restrictions: No   Pertinent Vitals/Pain 810; nursing made aware; ice placed.    ADL  Eating/Feeding: Simulated;Independent Where Assessed - Eating/Feeding: Chair Grooming: Simulated;Wash/dry hands;Set up Where Assessed - Grooming: Supported sitting Upper Body Bathing: Simulated;Chest;Right arm;Left arm;Abdomen;Supervision/safety;Set up Where Assessed - Upper Body Bathing: Unsupported sitting Lower Body Bathing: Simulated;Minimal assistance Where Assessed - Lower Body Bathing: Supported sit to stand Upper Body Dressing: Performed;Supervision/safety;Set up Where Assessed - Upper Body Dressing: Unsupported sitting Lower Body Dressing: Performed;Moderate assistance (unable to fully cross up LEs) Where Assessed - Lower Body Dressing: Supported sit to stand Toilet Transfer: Performed;Min Pension scheme manager Method: Other (comment) (with walker into bathroom) Toilet Transfer Equipment: Comfort height toilet;Grab bars Toileting - Clothing Manipulation and Hygiene: Simulated;Min guard Where Assessed - Toileting Clothing Manipulation and  Hygiene: Sit to stand from 3-in-1 or toilet Equipment Used: Long-handled shoe horn;Long-handled sponge;Reacher;Rolling walker;Sock aid ADL Comments: Pt currently unable to fully cross LEs up due to pain. he feels like he will be able to once pain improves. Wife present and can help. Also educated on AE kit and pt considering purchase. Demonstrated all AE and pt verbalized understanding. Demonstrated tub transfer technique but pt states he is in too much pain right now to practice tub transfer. he states "it wont be a problem" to step over tub once pain improves. He declines need to practice tub transfer for d/c. Wife states she can help also. Issued back care handout and reviewed.     OT Diagnosis:    OT Problem List:   OT Treatment Interventions:     OT Goals(Current goals can be found in the care plan section) Acute Rehab OT Goals Patient Stated Goal: decrease pain  Visit Information  Last OT Received On: 06/25/13 Assistance Needed: +1 History of Present Illness: Pt is s/p back surgery.       Prior Functioning     Home Living Family/patient expects to be discharged to:: Private residence Living Arrangements: Spouse/significant other Available Help at Discharge: Family Type of Home: House Home Access: Stairs to enter Secretary/administrator of Steps: 1 Entrance Stairs-Rails: None Home Layout: Two level Alternate Level Stairs-Number of Steps: 14 Alternate Level Stairs-Rails: Right Home Equipment: None Prior Function Level of Independence: Independent Communication Communication: No difficulties Dominant Hand: Right         Vision/Perception     Cognition  Cognition Arousal/Alertness: Awake/alert Behavior During Therapy: WFL for tasks assessed/performed Overall Cognitive Status: Within Functional Limits for tasks assessed    Extremity/Trunk Assessment Upper Extremity Assessment Upper Extremity Assessment: Overall WFL for tasks assessed     Mobility Bed  Mobility Bed Mobility: Rolling Left;Left Sidelying to  Sit Rolling Left: 5: Supervision Left Sidelying to Sit: 4: Min guard;HOB flat Details for Bed Mobility Assistance: min verbal cues for log roll technique. Transfers Transfers: Sit to Stand;Stand to Sit Sit to Stand: 4: Min guard;With upper extremity assist;From bed;From toilet Stand to Sit: 4: Min guard;With upper extremity assist;To toilet;To chair/3-in-1 Details for Transfer Assistance: min verbal cues for back precautions.     Exercise     Balance     End of Session OT - End of Session Activity Tolerance: Patient tolerated treatment well Patient left: in chair;with call bell/phone within reach;with family/visitor present  GO Functional Assessment Tool Used: clinical judgement Functional Limitation: Self care Self Care Current Status (N8295): At least 20 percent but less than 40 percent impaired, limited or restricted Self Care Goal Status (A2130): At least 20 percent but less than 40 percent impaired, limited or restricted Self Care Discharge Status [phone]): At least 20 percent but less than 40 percent impaired, limited or restricted   Lennox Laity [phone] 06/25/2013, 9:25 AM

## 2013-06-25 NOTE — Care Management Note (Signed)
    Page 1 of 1   06/25/2013     11:54:51 AM   CARE MANAGEMENT NOTE 06/25/2013  Patient:  Paul Dudley,Paul Dudley   Account Number:  [id number]  Date Initiated:  06/25/2013  Documentation initiated by:  Colleen Can  Subjective/Objective Assessment:   dx microlumbar discectomy    Worker's comp claim [id number]  doi-10/13/2012  [employer]  adjuster-Roger Enid Skeens  [phone]     Action/Plan:   CM spoke with patient . Plans are for patient to return to his home in [place] where spouse will be caregiver. Will need RW   Anticipated DC Date:  06/25/2013   Anticipated DC Plan:  HOME/SELF CARE      DC Planning Services  CM consult      PAC Choice  DURABLE MEDICAL EQUIPMENT   Choice offered to / List presented to:  C-1 Patient           Status of service:  Completed, signed off Medicare Important Message given?   (If response is "NO", the following Medicare IM given date fields will be blank) Date Medicare IM given:   Date Additional Medicare IM given:    Discharge Disposition:    Per UR Regulation:    If discussed at Long Length of Stay Meetings, dates discussed:    Comments:  06/25/2013 Colleen Can BSN RN CCM [phone] tct worker's comp case manager -Liliane Channel [phone] message on voice mail [phone] Fax [phone]. Per patient worker's comp Case manager called him and advised that [employer] will supply him with RW and pt's wife will pick up walker from worker's comp Sports coach. Pt discharged with spouse.

## 2013-06-26 NOTE — Op Note (Signed)
NAMEZEKI, BEDROSIAN                ACCOUNT NO.:  [id number]  MEDICAL RECORD NO.:  [id number]  LOCATION:  1621                         FACILITY:  [facility]  PHYSICIAN:  Jene Every, M.D.    DATE OF BIRTH:  1966-05-30  DATE OF PROCEDURE:  06/24/2013 DATE OF DISCHARGE:  06/25/2013                              OPERATIVE REPORT   ADDENDUM:  ASSISTANT:  Lanna Poche, PA, who has helped for general intermittent nerve root traction, suction, and help under operating microscope.     Jene Every, M.D.     Cordelia Pen  D:  06/25/2013  T:  06/26/2013  Job:  [id number]

## 2013-07-06 ENCOUNTER — Telehealth: Payer: Self-pay | Admitting: Internal Medicine

## 2013-07-06 NOTE — Telephone Encounter (Signed)
Go ahead and set this up 

## 2013-07-06 NOTE — Telephone Encounter (Signed)
Molli Hazard from [employer] [address] called and said that worker's comp is not paying for his actosplus met right now but he says if you send in metformin 500,850 or 1000mg  #60 then they would give to him for free.

## 2013-07-07 ENCOUNTER — Institutional Professional Consult (permissible substitution): Payer: Worker's Compensation | Admitting: Family Medicine

## 2013-07-07 ENCOUNTER — Other Ambulatory Visit: Payer: Self-pay

## 2013-07-07 MED ORDER — METFORMIN HCL 850 MG PO TABS: 850.0000 mg | ORAL_TABLET | Freq: Two times a day (BID) | ORAL | Status: DC

## 2013-07-07 NOTE — Telephone Encounter (Signed)
Sent in metformin 850 bid

## 2013-07-07 NOTE — Telephone Encounter (Signed)
Called to let pt know med was sent in

## 2013-10-13 ENCOUNTER — Ambulatory Visit: Payer: Worker's Compensation | Admitting: Family Medicine

## 2013-10-14 ENCOUNTER — Ambulatory Visit: Payer: Worker's Compensation | Admitting: Family Medicine

## 2013-10-20 ENCOUNTER — Ambulatory Visit: Payer: Worker's Compensation | Admitting: Family Medicine

## 2013-10-21 ENCOUNTER — Ambulatory Visit: Payer: Worker's Compensation | Admitting: Family Medicine

## 2013-10-22 ENCOUNTER — Other Ambulatory Visit: Payer: Self-pay

## 2013-10-27 ENCOUNTER — Ambulatory Visit: Payer: Worker's Compensation | Admitting: Family Medicine

## 2013-11-03 ENCOUNTER — Ambulatory Visit: Payer: Worker's Compensation | Admitting: Family Medicine

## 2013-11-11 ENCOUNTER — Encounter: Payer: Self-pay | Admitting: Family Medicine

## 2013-11-11 ENCOUNTER — Ambulatory Visit (INDEPENDENT_AMBULATORY_CARE_PROVIDER_SITE_OTHER): Payer: Self-pay | Admitting: Family Medicine

## 2013-11-11 VITALS — BP 114/70 | HR 110 | Wt 271.0 lb

## 2013-11-11 DIAGNOSIS — E119 Type 2 diabetes mellitus without complications: Secondary | ICD-10-CM

## 2013-11-11 DIAGNOSIS — E1169 Type 2 diabetes mellitus with other specified complication: Secondary | ICD-10-CM

## 2013-11-11 DIAGNOSIS — I10 Essential (primary) hypertension: Secondary | ICD-10-CM

## 2013-11-11 DIAGNOSIS — E1159 Type 2 diabetes mellitus with other circulatory complications: Secondary | ICD-10-CM

## 2013-11-11 DIAGNOSIS — G8929 Other chronic pain: Secondary | ICD-10-CM

## 2013-11-11 DIAGNOSIS — M549 Dorsalgia, unspecified: Secondary | ICD-10-CM

## 2013-11-11 DIAGNOSIS — E785 Hyperlipidemia, unspecified: Secondary | ICD-10-CM

## 2013-11-11 LAB — POCT GLYCOSYLATED HEMOGLOBIN (HGB A1C): Hemoglobin A1C: 7.7

## 2013-11-11 MED ORDER — PIOGLITAZONE HCL 30 MG PO TABS: 30.0000 mg | ORAL_TABLET | Freq: Every day | ORAL | Status: DC

## 2013-11-11 MED ORDER — METFORMIN HCL 850 MG PO TABS: 850.0000 mg | ORAL_TABLET | Freq: Two times a day (BID) | ORAL | Status: DC

## 2013-11-11 NOTE — Progress Notes (Signed)
Subjective:    Paul Dudley is a 47 y.o. male who presents for follow-up of Type 2 diabetes mellitus.  He is also status post back surgery. He is now being evaluated apparently for disability. He states that his orthopedic surgeon has turned back over to me. He is on daily dosing of Percocet. There is no record in the system concerning this.  Home blood sugar records: 160 to 170  Current symptoms/problems include none Daily foot checks: Any foot concerns:yes/ swelling after sitting a while Last eye exam:  12/13   Medication compliance: Current diet: low card Current exercise: walking /p/t Known diabetic complications: none Cardiovascular risk factors: hypertension and male gender   The following portions of the patient's history were reviewed and updated as appropriate: allergies, current medications, past medical history, past social history, past surgical history and problem list.  ROS as in subjective above    Objective:    BP 114/70  Pulse 110  Wt 271 lb (122.925 kg)  SpO2 98%  Filed Vitals:   11/11/13 1529  BP: 114/70  Pulse: 110    General appearence: alert, no distress, WD/WN Neck: supple, no lymphadenopathy, no thyromegaly, no masses Heart: RRR, normal S1, S2, no murmurs Lungs: CTA bilaterally, no wheezes, rhonchi, or rales Abdomen: +bs, soft, non tender, non distended, no masses, no hepatomegaly, no splenomegaly Pulses: 2+ symmetric, upper and lower extremities, normal cap refill Ext: no edema Foot exam:  Neuro: foot monofilament exam normal   Lab Review Lab Results  Component Value Date   HGBA1C 8.1 06/09/2013   Lab Results  Component Value Date   CHOL 226* 03/12/2013   HDL 34* 03/12/2013   LDLCALC 147* 03/12/2013   TRIG 223* 03/12/2013   CHOLHDL 6.6 03/12/2013   No results found for this basenameConcepcion Elk     Chemistry      Component Value Date/Time   NA 137 06/25/2013 0413   K 3.7 06/25/2013 0413   CL 100 06/25/2013 0413   CO2 25  06/25/2013 0413   BUN 12 06/25/2013 0413   CREATININE 0.98 06/25/2013 0413   CREATININE 1.10 10/26/2011 1451      Component Value Date/Time   CALCIUM 9.4 06/25/2013 0413   ALKPHOS 68 10/26/2011 1451   AST 19 10/26/2011 1451   ALT 26 10/26/2011 1451   BILITOT 0.5 10/26/2011 1451        Chemistry      Component Value Date/Time   NA 137 06/25/2013 0413   K 3.7 06/25/2013 0413   CL 100 06/25/2013 0413   CO2 25 06/25/2013 0413   BUN 12 06/25/2013 0413   CREATININE 0.98 06/25/2013 0413   CREATININE 1.10 10/26/2011 1451      Component Value Date/Time   CALCIUM 9.4 06/25/2013 0413   ALKPHOS 68 10/26/2011 1451   AST 19 10/26/2011 1451   ALT 26 10/26/2011 1451   BILITOT 0.5 10/26/2011 1451         Assessment:  Type II or unspecified type diabetes mellitus without mention of complication, not stated as uncontrolled - Plan: POCT glycosylated hemoglobin (Hb A1C), metFORMIN (GLUCOPHAGE) 850 MG tablet, pioglitazone (ACTOS) 30 MG tablet  Hypertension associated with diabetes  Hyperlipidemia LDL goal < 70  Chronic back pain        Plan:    1.  Rx changes: He is to split up his Actos plus. 2.  Education: Reviewed 'ABCs' of diabetes management (respective goals in parentheses):  A1C (<7), blood pressure (<130/80), and cholesterol (  LDL <100). 3.  Compliance at present is estimated to be good. Efforts to improve compliance (if necessary) will be directed at increased exercise. 4. Follow up: 4 months  I explained that I could not refill his Percocet at this time since I have no record of this. Also explained that I would probably refer him on to pain management for this. I will split up his Actos plus. Recheck here in 4 months.

## 2013-11-11 NOTE — Patient Instructions (Signed)
Finish up the Actos plus and then start on the new medication regimen. Check your blood sugars I

## 2013-11-17 ENCOUNTER — Telehealth: Payer: Self-pay | Admitting: Internal Medicine

## 2013-11-17 ENCOUNTER — Other Ambulatory Visit: Payer: Self-pay

## 2013-11-17 DIAGNOSIS — E119 Type 2 diabetes mellitus without complications: Secondary | ICD-10-CM

## 2013-11-17 MED ORDER — GLIPIZIDE 5 MG PO TABS: 5.0000 mg | ORAL_TABLET | Freq: Two times a day (BID) | ORAL | Status: DC

## 2013-11-17 NOTE — Telephone Encounter (Signed)
Pt needs a refill on his glipizide 5mg  tp [employer], [employer] @ [employer]

## 2013-11-17 NOTE — Telephone Encounter (Signed)
done

## 2013-11-17 NOTE — Telephone Encounter (Signed)
Sent in glipized

## 2014-01-28 DIAGNOSIS — Z029 Encounter for administrative examinations, unspecified: Secondary | ICD-10-CM

## 2014-02-11 ENCOUNTER — Telehealth: Payer: Self-pay | Admitting: Internal Medicine

## 2014-02-11 NOTE — Telephone Encounter (Signed)
Faxed over medical records to disability determination services

## 2014-03-11 ENCOUNTER — Ambulatory Visit: Payer: Self-pay | Admitting: Family Medicine

## 2014-03-16 ENCOUNTER — Ambulatory Visit: Payer: Self-pay | Admitting: Family Medicine

## 2014-03-22 ENCOUNTER — Ambulatory Visit: Payer: Self-pay | Admitting: Family Medicine

## 2014-04-01 ENCOUNTER — Ambulatory Visit: Payer: Self-pay | Admitting: Family Medicine

## 2014-04-06 ENCOUNTER — Ambulatory Visit: Payer: Self-pay | Admitting: Family Medicine

## 2014-04-15 ENCOUNTER — Ambulatory Visit: Payer: Self-pay | Admitting: Family Medicine

## 2014-04-22 ENCOUNTER — Ambulatory Visit: Payer: Self-pay | Admitting: Family Medicine

## 2014-04-27 ENCOUNTER — Ambulatory Visit: Payer: Self-pay | Admitting: Family Medicine

## 2014-05-07 ENCOUNTER — Telehealth: Payer: Self-pay | Admitting: Internal Medicine

## 2014-05-07 NOTE — Telephone Encounter (Signed)
Faxed medical records to [employer]

## 2014-05-11 ENCOUNTER — Ambulatory Visit (INDEPENDENT_AMBULATORY_CARE_PROVIDER_SITE_OTHER): Payer: Self-pay | Admitting: Family Medicine

## 2014-05-11 ENCOUNTER — Encounter: Payer: Self-pay | Admitting: Family Medicine

## 2014-05-11 VITALS — BP 122/84 | HR 64 | Wt 177.0 lb

## 2014-05-11 DIAGNOSIS — F3289 Other specified depressive episodes: Secondary | ICD-10-CM

## 2014-05-11 DIAGNOSIS — I10 Essential (primary) hypertension: Secondary | ICD-10-CM

## 2014-05-11 DIAGNOSIS — F329 Major depressive disorder, single episode, unspecified: Secondary | ICD-10-CM

## 2014-05-11 DIAGNOSIS — M549 Dorsalgia, unspecified: Secondary | ICD-10-CM

## 2014-05-11 DIAGNOSIS — E1159 Type 2 diabetes mellitus with other circulatory complications: Secondary | ICD-10-CM

## 2014-05-11 DIAGNOSIS — G8929 Other chronic pain: Secondary | ICD-10-CM

## 2014-05-11 DIAGNOSIS — E1169 Type 2 diabetes mellitus with other specified complication: Secondary | ICD-10-CM

## 2014-05-11 DIAGNOSIS — E119 Type 2 diabetes mellitus without complications: Secondary | ICD-10-CM

## 2014-05-11 DIAGNOSIS — E785 Hyperlipidemia, unspecified: Secondary | ICD-10-CM | POA: Insufficient documentation

## 2014-05-11 LAB — POCT GLYCOSYLATED HEMOGLOBIN (HGB A1C): HEMOGLOBIN A1C: 10.2

## 2014-05-11 MED ORDER — LISINOPRIL-HYDROCHLOROTHIAZIDE 10-12.5 MG PO TABS: 1.0000 | ORAL_TABLET | Freq: Every day | ORAL | Status: DC

## 2014-05-11 MED ORDER — VENLAFAXINE HCL ER 75 MG PO CP24: 75.0000 mg | ORAL_CAPSULE | Freq: Two times a day (BID) | ORAL | Status: DC

## 2014-05-11 MED ORDER — GLIPIZIDE 5 MG PO TABS: 5.0000 mg | ORAL_TABLET | Freq: Two times a day (BID) | ORAL | Status: DC

## 2014-05-11 MED ORDER — ATORVASTATIN CALCIUM 20 MG PO TABS: 20.0000 mg | ORAL_TABLET | Freq: Every day | ORAL | Status: DC

## 2014-05-11 MED ORDER — VENLAFAXINE HCL ER 37.5 MG PO CP24: 37.5000 mg | ORAL_CAPSULE | Freq: Two times a day (BID) | ORAL | Status: DC

## 2014-05-11 MED ORDER — METFORMIN HCL 850 MG PO TABS: 850.0000 mg | ORAL_TABLET | Freq: Two times a day (BID) | ORAL | Status: DC

## 2014-05-11 MED ORDER — PIOGLITAZONE HCL 30 MG PO TABS: 30.0000 mg | ORAL_TABLET | Freq: Every day | ORAL | Status: DC

## 2014-05-11 NOTE — Progress Notes (Signed)
   Subjective:    Patient ID: Paul Dudley, male    DOB: 1966-11-10, 48 y.o.   MRN: [id number]  HPI He is here for followup visit. He had surgery on his back in July and continues to have difficulty with this. He last saw his orthopedic surgeon in December. He has not been able to return to work and is apparently applying for disability. He has been taking Tylenol and also Advil. Been taking one Advil every 4 hours. He has had a great deal of difficulty emotionally dealing with this and it is also had financial as well as marital concerns. He has had sleep difficulties, mood swings, crying. He has been taking his other medications but admits to cutting back on them due to cost. He does state his blood sugar in the morning has been running in the 180-200 range.   Review of Systems     Objective:   Physical Exam Alert and in no distress. He has a slightly flat affect. Hemoglobin A1c is 10.2. Last note from orthopedics was reviewed. Apparently no referral was given for physical therapy      Assessment & Plan:  Type II or unspecified type diabetes mellitus without mention of complication, not stated as uncontrolled - Plan: HgB A1c, glipiZIDE (GLUCOTROL) 5 MG tablet, metFORMIN (GLUCOPHAGE) 850 MG tablet, pioglitazone (ACTOS) 30 MG tablet  Chronic back pain - Plan: Ambulatory referral to Physical Therapy  Hypertension associated with diabetes - Plan: lisinopril-hydrochlorothiazide (PRINZIDE,ZESTORETIC) 10-12.5 MG per tablet  Hyperlipidemia LDL goal < 70 - Plan: atorvastatin (LIPITOR) 20 MG tablet  Depressive disorder, not elsewhere classified - Plan: venlafaxine XR (EFFEXOR XR) 37.5 MG 24 hr capsule, venlafaxine XR (EFFEXOR XR) 75 MG 24 hr capsule  Reinforced the need for him to check his blood sugars either before a meal or 2 hours after a meal. I will refer him to physical therapy to help with a general rehabilitation program. Continue his other medications. I will give him his Effexor to  also potentially help with his pain management. He plans to continue to work on getting on disability.

## 2014-05-11 NOTE — Patient Instructions (Addendum)
You can take as many as 4 Advil 3 times per day. If the Advil does not work then he can switch to Aleve in the dosing of that would be 2 pills twice per day Take the venlafaxine 37.5 mg twice per day until they run out then switch to the 75 mg by spreading.

## 2014-05-25 ENCOUNTER — Telehealth: Payer: Self-pay | Admitting: Family Medicine

## 2014-05-25 NOTE — Telephone Encounter (Signed)
Received records request from [employer] to fax records to  [phone]. Also sending hard copy in envelope provided. Address sent to is [employer] [address] [place], [place] [address].

## 2014-05-25 NOTE — Telephone Encounter (Signed)
Pt verified with his attorney that it was ok for [place] to send records to [facility] disability and we have signed record release

## 2014-06-22 ENCOUNTER — Ambulatory Visit: Payer: Self-pay | Admitting: Family Medicine

## 2014-06-22 ENCOUNTER — Ambulatory Visit: Payer: Self-pay | Admitting: Medical

## 2014-07-14 ENCOUNTER — Ambulatory Visit: Payer: Self-pay | Admitting: Family Medicine

## 2014-07-19 ENCOUNTER — Encounter: Payer: Self-pay | Admitting: Family Medicine

## 2014-07-19 ENCOUNTER — Ambulatory Visit (INDEPENDENT_AMBULATORY_CARE_PROVIDER_SITE_OTHER): Payer: Self-pay | Admitting: Family Medicine

## 2014-07-19 VITALS — BP 126/80 | HR 83 | Wt 172.0 lb

## 2014-07-19 DIAGNOSIS — E1159 Type 2 diabetes mellitus with other circulatory complications: Secondary | ICD-10-CM

## 2014-07-19 DIAGNOSIS — I10 Essential (primary) hypertension: Secondary | ICD-10-CM

## 2014-07-19 DIAGNOSIS — E1169 Type 2 diabetes mellitus with other specified complication: Secondary | ICD-10-CM

## 2014-07-19 DIAGNOSIS — E119 Type 2 diabetes mellitus without complications: Secondary | ICD-10-CM

## 2014-07-19 DIAGNOSIS — E785 Hyperlipidemia, unspecified: Secondary | ICD-10-CM

## 2014-07-19 DIAGNOSIS — G8929 Other chronic pain: Secondary | ICD-10-CM

## 2014-07-19 DIAGNOSIS — M549 Dorsalgia, unspecified: Secondary | ICD-10-CM

## 2014-07-19 LAB — POCT GLYCOSYLATED HEMOGLOBIN (HGB A1C): HEMOGLOBIN A1C: 11

## 2014-07-19 MED ORDER — VENLAFAXINE HCL ER 150 MG PO CP24: 150.0000 mg | ORAL_CAPSULE | Freq: Every day | ORAL | Status: AC

## 2014-07-19 MED ORDER — INSULIN DETEMIR 100 UNIT/ML ~~LOC~~ SOLN: 10.0000 [IU] | Freq: Every day | SUBCUTANEOUS | Status: AC

## 2014-07-19 NOTE — Patient Instructions (Signed)
Check your sugars either before a meal or 2 hours after a meal.. start checking them twice per day. The first one should be in a morning. We'll start you on insulin and I want you to slowly increase your insulin dosing by 2 units every 2 days until your morning blood sugar is below 120. Stop the glipizide

## 2014-07-19 NOTE — Progress Notes (Signed)
   Subjective:    Patient ID: Paul Dudley, male    DOB: 06/02/1966, 48 y.o.   MRN: [id number]  HPI He is here for followup visit. He states that the 75 mg of the Effexor his reduced the pain to roughly 40%. He does stay active with walking and going to physical therapy. He does check his feet regularly and had an eye exam in December of last year. He is applying for disability and apparently will hear sometime soon. This is due to chronic back pain. He states his blood sugars run around 170-180 before meals. He has not checked them after meals. He continues on medications listed in the chart. Smoking and drinking were reviewed.   Review of Systems     Objective:   Physical Exam Alert and in no distress otherwise not examined. Hemoglobin A1c is 11.0.       Assessment & Plan:  Type II or unspecified type diabetes mellitus without mention of complication, not stated as uncontrolled - Plan: POCT glycosylated hemoglobin (Hb A1C), insulin detemir (LEVEMIR) 100 UNIT/ML injection, Amb Referral to Nutrition and Diabetic E  Hyperlipidemia with target LDL less than 70  Hypertension associated with diabetes  Chronic back pain - Plan: venlafaxine XR (EFFEXOR XR) 150 MG 24 hr capsule  I will increase his Effexor. He is to let he know how it does. Discussed care of his diabetes in regard to blood sugars. Recommend checking them before meals and 2 hours after meals. I will stop his glyburide. He was started on Levemir and given instructions on proper use. He is to call if any problems otherwise I will see him in 3 months.

## 2014-07-20 ENCOUNTER — Telehealth: Payer: Self-pay | Admitting: Family Medicine

## 2014-07-20 NOTE — Telephone Encounter (Signed)
Pt doesn't have ins until possibly the end of this month. He was given a rx for levemir and a discount card. He states that discount card only works in conjunction with insurance. So pt is requesting samples until he can get ins straightened out. Med is too expensive for him to pay out of pocket.

## 2014-07-20 NOTE — Telephone Encounter (Signed)
I HAVE TALKED TO DRUG REP THE PHARMACY DID NOT TELL THE PT THE TRUTH THE CARD IS GOOD FOR ANYONE BUT ADVISED HIM OF PT ASSISTANT HELP WITH MED THROUGH L[facility] AND I HAVE GIVEN HIM TWO SAMPLES

## 2014-07-27 ENCOUNTER — Telehealth: Payer: Self-pay | Admitting: Family Medicine

## 2014-07-27 NOTE — Telephone Encounter (Signed)
lm

## 2014-07-30 ENCOUNTER — Telehealth: Payer: Self-pay | Admitting: Family Medicine

## 2014-07-30 NOTE — Telephone Encounter (Signed)
Pt informed

## 2014-08-03 ENCOUNTER — Telehealth: Payer: Self-pay | Admitting: Medical

## 2014-08-03 NOTE — Telephone Encounter (Signed)
lm

## 2014-09-02 ENCOUNTER — Encounter: Payer: Self-pay | Admitting: *Deleted

## 2014-09-02 ENCOUNTER — Encounter: Payer: Self-pay | Attending: Family Medicine | Admitting: *Deleted

## 2014-09-02 ENCOUNTER — Telehealth: Payer: Self-pay | Admitting: Family Medicine

## 2014-09-02 ENCOUNTER — Other Ambulatory Visit: Payer: Self-pay

## 2014-09-02 VITALS — Ht 67.0 in | Wt 170.3 lb

## 2014-09-02 DIAGNOSIS — Z713 Dietary counseling and surveillance: Secondary | ICD-10-CM | POA: Insufficient documentation

## 2014-09-02 DIAGNOSIS — Z794 Long term (current) use of insulin: Secondary | ICD-10-CM | POA: Insufficient documentation

## 2014-09-02 DIAGNOSIS — E119 Type 2 diabetes mellitus without complications: Secondary | ICD-10-CM | POA: Insufficient documentation

## 2014-09-02 MED ORDER — INSULIN SYRINGE-NEEDLE U-100 30G 0.5 ML MISC: 1.0000 | Freq: Every day | Status: AC

## 2014-09-02 NOTE — Telephone Encounter (Signed)
DONE

## 2014-09-02 NOTE — Telephone Encounter (Signed)
Pt stopped by and stated that he has the incorrect needles for his medication. Pt has pt assistance for medication. Pt needs 6 mm 30-50 units at least 30 gauge.

## 2014-09-02 NOTE — Progress Notes (Signed)
Appt start time: 0800 end time:  0930.  Assessment:  Patient was seen on  09/02/14 for individual diabetes education. History of Diabetes since 2007, no previous diabetes education. Lives with wife and 3 of his 5 kids. Has just started on insulin recently. He was given samples of insulin pens and instructed in MD office. The Rx was for insulin in vials with syringe, but has not been instructed on that yet. 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 He has not been able to return to work, which causes financial stresses with his family. He states he has applied for Disability but has not been approved yet.   Patient Education Plan per assessed needs and concerns is to attend individual session for Diabetes Self Management Education.  Current HbA1c: 11.0%  Preferred Learning Style:   Hands on  Learning Readiness:   Ready  Change in progress  MEDICATIONS: see list, Diabetes medications are Levemir and Metformin  DIETARY INTAKE:  24-hr recall:  B ( AM): Raisin Bran or Cheerios cereal with whole milk OR 2 pkgs flavored oatmeal or regular cream of wheat with brown sugar OR whole bagel with raisins OR 1 egg McMuffin sandwich with hash browns and OJ, coffee with creamer and 3 pkgs sugar Snk ( AM): watermelon or other fresh fruit  L ( PM): left overs, usually [place] food: refried beans, tortillas, tacos, etc. Likes "soul food", lots of vegetables and beans, 16 oz fruit juice or more Snk ( PM): same as AM D ( PM): same as lunch, [place] foods, lots of vegetables, beans, most meat is ground beef, baked chicken and fish (salmon), 16 oz fruit juice Snk ( PM): more fresh fruit Beverages: 16 oz fruit juice, flavored water,   Usual physical activity: walks in neighborhood every other night  Estimated energy needs: 1600 calories 180 g carbohydrates 120 g protein 44 g fat  Progress Towards Goal(s):  In progress.   Nutritional  Diagnosis:  NB-1.1 Food and nutrition-related knowledge deficit As related to Diabetes.  As evidenced by A1c of 11.0%.    Intervention:  Nutrition counseling provided.  Discussed diabetes disease process and treatment options.  Discussed physiology of diabetes and role of obesity on insulin resistance.    Discussed role of medications and diet in glucose control  Reviewed patient medications.  Discussed role of medication on blood glucose and possible side effects  Discussed blood glucose monitoring and interpretation.  Discussed recommended target ranges and individual ranges.    Provided insulin instruction with use of syringe and vial:  Insulin Action of Levemir insulin  Reviewed syringe & vial VS pen including # units per syringe,    length of needles, vial VS Pen cartridge and needles  Hygiene and storage  Drawing up single and mixed doses if using vials   Single dose  Rotation of Sites  Hypoglycemia- symptoms, causes , treatment choices  Record keeping and MD follow up  Hypoglycemia, causes, symptoms and treatment   At next visit, plan to   provide education on macronutrients on glucose levels.  Will provide education on carb counting, importance of regularly scheduled meals/snacks, and meal planning  Discuss effects of physical activity on glucose levels and long-term glucose control.  Recommended 150 minutes of physical activity/week.  Describ short-term complications: hyper- and hypo-glycemia.  Discussed causes,symptoms, and treatment options.  Discuss prevention, detection, and treatment of long-term complications.  Discuss the role of prolonged  elevated glucose levels on body systems.  Discuss role of stress on blood glucose levels and discussed strategies to manage psychosocial issues.  Discussed recommendations for long-term diabetes self-care.  Established checklist for medical, dental, and emotional self-care.  Plan:  Look for fruit juices that are called 50/50.  They have half the sugar of regular juices. Continue with your activity level by walking daily as tolerated Consider checking BG at alternate times per day as directed by MD  Continue taking medication Levemir and Metformin as directed by MD Call MD for new Rx for syringes with 6 mm needle, 30-50 units, at least 30 gauge, then contact Levemir to replace what you have ReliOn meter at [facility] has less expensive strips  YOUR DR. SAID: Check your sugars either before a meal or 2 hours after a meal.. start checking them twice per day. The first one should be in a morning. We'll start you on insulin and I want you to slowly increase your insulin dosing by 2 units every 2 days until your morning blood sugar is below 120.  Teaching Method Utilized: Visual, Auditory and Hands on  Handouts given during visit include: Living Well with Diabetes Carb Counting and Food Label handouts Meal Plan Card  Insulin Action handout  Barriers to learning/adherence to lifestyle change: new diagnosis of chronic disease requiring insulin administration  Diabetes self-care support plan:   [facility] support group available  Demonstrated degree of understanding via:  Teach Back   Monitoring/Evaluation:  Dietary intake, exercise, take insulin as prescribed, and body weight in 1 month(s).      Patient demonstrated understanding of insulin administration by return demonstration.  Patient received the following handouts:  Insulin Instruction Handout  Mixing Insulin Brochure by [facility] Getting Started                                        Patient to start on insulin as Rx'd by MD  Patient will be seen for follow-up as needed.

## 2014-09-02 NOTE — Patient Instructions (Addendum)
Plan:  Look for fruit juices that are called 50/50. They have half the sugar of regular juices. Continue with your activity level by walking daily as tolerated Consider checking BG at alternate times per day as directed by MD  Continue taking medication Levemir and Metformin as directed by MD Call MD for new Rx for syringes with 6 mm needle, 30-50 units, at least 30 gauge, then contact Levemir to replace what you have ReliOn meter at [facility] has less expensive strips  YOUR DR. SAID: Check your sugars either before a meal or 2 hours after a meal.. start checking them twice per day. The first one should be in a morning. We'll start you on insulin and I want you to slowly increase your insulin dosing by 2 units every 2 days until your morning blood sugar is below 120.

## 2014-09-16 ENCOUNTER — Encounter: Payer: Self-pay | Attending: Family Medicine | Admitting: *Deleted

## 2014-09-16 VITALS — Ht 67.0 in | Wt 170.0 lb

## 2014-09-16 DIAGNOSIS — Z794 Long term (current) use of insulin: Secondary | ICD-10-CM | POA: Insufficient documentation

## 2014-09-16 DIAGNOSIS — E1165 Type 2 diabetes mellitus with hyperglycemia: Secondary | ICD-10-CM | POA: Insufficient documentation

## 2014-09-16 DIAGNOSIS — E118 Type 2 diabetes mellitus with unspecified complications: Secondary | ICD-10-CM

## 2014-09-16 DIAGNOSIS — IMO0002 Reserved for concepts with insufficient information to code with codable children: Secondary | ICD-10-CM

## 2014-09-16 DIAGNOSIS — Z713 Dietary counseling and surveillance: Secondary | ICD-10-CM | POA: Insufficient documentation

## 2014-09-16 NOTE — Progress Notes (Signed)
Appt start time: 1600 end time:  1630.  Assessment:  Patient was seen on  09/16/14 for individual diabetes education follow up visit. States he is doing fine with insulin administration now.  His current Levemir dose is at 20 units taken at night. He continues to SMBG with reported range of 186-225 mg/dl, a gradual improvement.   Patient Education Plan per assessed needs and concerns is to attend individual session for Diabetes Self Management Education.  Current HbA1c: 11.0%  Preferred Learning Style:   Hands on  Learning Readiness:   Ready  Change in progress  MEDICATIONS: see list, Diabetes medications are Levemir and Metformin  DIETARY INTAKE:  24-hr recall:  B ( AM): Raisin Bran or Cheerios cereal with whole milk OR 2 pkgs flavored oatmeal or regular cream of wheat with brown sugar OR whole bagel with raisins OR 1 egg McMuffin sandwich with hash browns and OJ, coffee with creamer and 3 pkgs sugar Snk ( AM): watermelon or other fresh fruit  L ( PM): left overs, usually [place] food: refried beans, tortillas, tacos, etc. Likes "soul food", lots of vegetables and beans, 16 oz fruit juice or more Snk ( PM): same as AM D ( PM): same as lunch, [place] foods, lots of vegetables, beans, most meat is ground beef, baked chicken and fish (salmon), 16 oz fruit juice Snk ( PM): more fresh fruit Beverages: less fruit juice, more flavored water,   Usual physical activity: walks in neighborhood every other night  Estimated energy needs: 1600 calories 180 g carbohydrates 120 g protein 44 g fat  Progress Towards Goal(s):  In progress.   Nutritional Diagnosis:  NB-1.1 Food and nutrition-related knowledge deficit As related to Diabetes.  As evidenced by A1c of 11.0%.    Intervention:    Provided education on macronutrients on glucose levels. Provided education on carb counting by food group and the importance of regularly scheduled meals/snacks, and meal planning. He became frustrated  with dealing with a specific number of carb choices, so I asked him to just practice identifying foods that are Starch, Fruit or Milk as carb containing foods for now  Reviewed insulin action of Levemir and reminded him of the MD instructions to increase the dose by 2 units each week until FBG's are below 150 mg/dl.  Plan:  Consider noticing which foods you are eating that are Carb choices and which ones aren't  Continue taking your Levemir as directed Consider increasing the dose by 2 units for a week as directed by your MD until fasting BG's are below 150 mg/dl  Teaching Method Utilized: Visual, Auditory and Hands on  Handouts given during visit include: Carb Counting and Food Label handouts Meal Plan Card  Barriers to learning/adherence to lifestyle change: new diagnosis of chronic disease requiring insulin administration  Diabetes self-care support plan:   [facility] support group available  Demonstrated degree of understanding via:  Teach Back   Monitoring/Evaluation:  Dietary intake, exercise, take insulin as prescribed, and body weight in 1 month(s).

## 2014-09-16 NOTE — Patient Instructions (Signed)
Plan:  Consider noticing which foods you are eating that are Carb choices and which ones aren't  Continue taking your Levemir as directed Consider increasing the dose by 2 units for a week as directed by your MD until fasting BG's are below 150 mg/dl

## 2014-09-24 ENCOUNTER — Telehealth: Payer: Self-pay | Admitting: Internal Medicine

## 2014-09-24 NOTE — Telephone Encounter (Signed)
Pt called and states that we gave him insulin needles that were too big for him and that they hurt when he used them. I asked the pt to come in and bring me what he had that was not open so i could explain and see what he was talking about. Pt had 29g insulin needles that was about 0.5 inch so I switched them out for 30g insulin needle and told pt that the gauges were about the same, but I explained how to inject again and that with the needle length that it was better to stick it at least 3/4 of the way in to make sure he was getting all the levemir medicine into him and not leaking out. Pt was aware. Pt is on the assistance program so that is why we gave him the needles as he can not afford them. He was given about 5 bags of 30g insulin needles for the exchange of his bags as they were not open

## 2014-10-13 ENCOUNTER — Ambulatory Visit: Payer: Self-pay | Admitting: *Deleted

## 2014-10-16 LAB — HM DIABETES EYE EXAM

## 2014-10-18 ENCOUNTER — Encounter: Payer: Self-pay | Admitting: Internal Medicine

## 2014-10-20 ENCOUNTER — Ambulatory Visit: Payer: Self-pay | Admitting: Family Medicine

## 2014-11-08 ENCOUNTER — Ambulatory Visit: Payer: Self-pay | Admitting: Family Medicine

## 2014-11-15 ENCOUNTER — Telehealth: Payer: Self-pay | Admitting: Internal Medicine

## 2014-11-15 NOTE — Telephone Encounter (Signed)
Request for levemir to the [employer] patient assistance program. Please call in rx to [phone] or fax rx to [phone]

## 2014-11-16 ENCOUNTER — Ambulatory Visit: Payer: Self-pay | Admitting: *Deleted

## 2014-11-16 ENCOUNTER — Telehealth: Payer: Self-pay | Admitting: Family Medicine

## 2014-11-16 ENCOUNTER — Other Ambulatory Visit: Payer: Self-pay

## 2014-11-16 DIAGNOSIS — E119 Type 2 diabetes mellitus without complications: Secondary | ICD-10-CM

## 2014-11-16 MED ORDER — METFORMIN HCL 850 MG PO TABS: 850.0000 mg | ORAL_TABLET | Freq: Two times a day (BID) | ORAL | Status: DC

## 2014-11-16 MED ORDER — PIOGLITAZONE HCL 30 MG PO TABS: 30.0000 mg | ORAL_TABLET | Freq: Every day | ORAL | Status: DC

## 2014-11-16 NOTE — Telephone Encounter (Signed)
Pt out of town caring for sick father for a week or so. Requesting refill on Metformin & Actos to [facility] [address], [place]

## 2014-11-16 NOTE — Telephone Encounter (Signed)
done

## 2014-11-16 NOTE — Telephone Encounter (Signed)
I called [employer] patient assistance program for his Levemir and the Rep. States that it will arrive at our office in  7 to 10 business days.

## 2014-11-18 ENCOUNTER — Telehealth: Payer: Self-pay | Admitting: Family Medicine

## 2014-11-18 NOTE — Telephone Encounter (Signed)
lm

## 2014-11-25 ENCOUNTER — Encounter: Payer: Self-pay | Admitting: Family Medicine

## 2014-11-25 ENCOUNTER — Ambulatory Visit (INDEPENDENT_AMBULATORY_CARE_PROVIDER_SITE_OTHER): Payer: Self-pay | Admitting: Family Medicine

## 2014-11-25 ENCOUNTER — Other Ambulatory Visit: Payer: Self-pay

## 2014-11-25 VITALS — BP 130/80 | HR 115 | Temp 98.7°F | Wt 157.0 lb

## 2014-11-25 DIAGNOSIS — E1169 Type 2 diabetes mellitus with other specified complication: Secondary | ICD-10-CM

## 2014-11-25 DIAGNOSIS — G8929 Other chronic pain: Secondary | ICD-10-CM

## 2014-11-25 DIAGNOSIS — Z91199 Patient's noncompliance with other medical treatment and regimen due to unspecified reason: Secondary | ICD-10-CM

## 2014-11-25 DIAGNOSIS — R1011 Right upper quadrant pain: Secondary | ICD-10-CM

## 2014-11-25 DIAGNOSIS — M549 Dorsalgia, unspecified: Secondary | ICD-10-CM

## 2014-11-25 DIAGNOSIS — E785 Hyperlipidemia, unspecified: Secondary | ICD-10-CM

## 2014-11-25 DIAGNOSIS — Z794 Long term (current) use of insulin: Secondary | ICD-10-CM

## 2014-11-25 DIAGNOSIS — Z9119 Patient's noncompliance with other medical treatment and regimen: Secondary | ICD-10-CM

## 2014-11-25 DIAGNOSIS — I10 Essential (primary) hypertension: Secondary | ICD-10-CM

## 2014-11-25 DIAGNOSIS — E1159 Type 2 diabetes mellitus with other circulatory complications: Secondary | ICD-10-CM

## 2014-11-25 DIAGNOSIS — E119 Type 2 diabetes mellitus without complications: Secondary | ICD-10-CM

## 2014-11-25 LAB — CBC WITH DIFFERENTIAL/PLATELET
BASOS ABS: 0 10*3/uL (ref 0.0–0.1)
Basophils Relative: 0 % (ref 0–1)
EOS ABS: 0 10*3/uL (ref 0.0–0.7)
Eosinophils Relative: 0 % (ref 0–5)
HCT: 35.5 % — ABNORMAL LOW (ref 39.0–52.0)
Hemoglobin: 12.1 g/dL — ABNORMAL LOW (ref 13.0–17.0)
LYMPHS PCT: 24 % (ref 12–46)
Lymphs Abs: 2 10*3/uL (ref 0.7–4.0)
MCH: 27.9 pg (ref 26.0–34.0)
MCHC: 34.1 g/dL (ref 30.0–36.0)
MCV: 82 fL (ref 78.0–100.0)
MPV: 9.7 fL (ref 9.4–12.4)
Monocytes Absolute: 0.4 10*3/uL (ref 0.1–1.0)
Monocytes Relative: 5 % (ref 3–12)
NEUTROS PCT: 71 % (ref 43–77)
Neutro Abs: 6 10*3/uL (ref 1.7–7.7)
PLATELETS: 304 10*3/uL (ref 150–400)
RBC: 4.33 MIL/uL (ref 4.22–5.81)
RDW: 13.2 % (ref 11.5–15.5)
WBC: 8.4 10*3/uL (ref 4.0–10.5)

## 2014-11-25 LAB — POCT GLYCOSYLATED HEMOGLOBIN (HGB A1C): HEMOGLOBIN A1C: 11

## 2014-11-25 LAB — COMPREHENSIVE METABOLIC PANEL
ALT: 16 U/L (ref 0–53)
AST: 11 U/L (ref 0–37)
Albumin: 4.7 g/dL (ref 3.5–5.2)
Alkaline Phosphatase: 57 U/L (ref 39–117)
BILIRUBIN TOTAL: 0.7 mg/dL (ref 0.2–1.2)
BUN: 19 mg/dL (ref 6–23)
CO2: 25 mEq/L (ref 19–32)
Calcium: 9.8 mg/dL (ref 8.4–10.5)
Chloride: 96 mEq/L (ref 96–112)
Creat: 1.35 mg/dL (ref 0.50–1.35)
Glucose, Bld: 367 mg/dL — ABNORMAL HIGH (ref 70–99)
Potassium: 4.1 mEq/L (ref 3.5–5.3)
SODIUM: 133 meq/L — AB (ref 135–145)
Total Protein: 8 g/dL (ref 6.0–8.3)

## 2014-11-25 LAB — LIPID PANEL
CHOL/HDL RATIO: 4 ratio
Cholesterol: 156 mg/dL (ref 0–200)
HDL: 39 mg/dL — ABNORMAL LOW (ref >39)
LDL CALC: 84 mg/dL (ref 0–99)
Triglycerides: 167 mg/dL — ABNORMAL HIGH (ref <150)
VLDL: 33 mg/dL (ref 0–40)

## 2014-11-25 MED ORDER — LISINOPRIL-HYDROCHLOROTHIAZIDE 10-12.5 MG PO TABS: 1.0000 | ORAL_TABLET | Freq: Every day | ORAL | Status: DC

## 2014-11-25 MED ORDER — METFORMIN HCL 850 MG PO TABS: 850.0000 mg | ORAL_TABLET | Freq: Two times a day (BID) | ORAL | Status: DC

## 2014-11-25 MED ORDER — PIOGLITAZONE HCL 30 MG PO TABS: 30.0000 mg | ORAL_TABLET | Freq: Every day | ORAL | Status: DC

## 2014-11-25 NOTE — Progress Notes (Deleted)
  Subjective:    Paul Dudley is a 48 y.o. male who presents for follow-up of Type 2 diabetes mellitus.    Home blood sugar records: patient test bid   Current symptoms/problems none Daily foot checks: ***   Any foot concerns: *** yes/none Last eye exam:  10/16/14   Medication compliance: Current diet: has changed eating has been to classes Current exercise: taking care of father  Known diabetic complications: {diabetes complications:1215} Cardiovascular risk factors: {cv risk factors:510}   {Common ambulatory SmartLinks:19316}  ROS as in subjective above    Objective:    There were no vitals taken for this visit.  There were no vitals filed for this visit.  General appearence: alert, no distress, WD/WN Neck: supple, no lymphadenopathy, no thyromegaly, no masses Heart: RRR, normal S1, S2, no murmurs Lungs: CTA bilaterally, no wheezes, rhonchi, or rales Abdomen: +bs, soft, non tender, non distended, no masses, no hepatomegaly, no splenomegaly Pulses: 2+ symmetric, upper and lower extremities, normal cap refill Ext: no edema Foot exam:  Neuro: foot monofilament exam normal   Lab Review Lab Results  Component Value Date   HGBA1C 11.0 07/19/2014   Lab Results  Component Value Date   CHOL 226* 03/12/2013   HDL 34* 03/12/2013   LDLCALC 147* 03/12/2013   TRIG 223* 03/12/2013   CHOLHDL 6.6 03/12/2013   No results found for: Concepcion ElkMICROALBUR, [id number]   Chemistry      Component Value Date/Time   NA 137 06/25/2013 0413   K 3.7 06/25/2013 0413   CL 100 06/25/2013 0413   CO2 25 06/25/2013 0413   BUN 12 06/25/2013 0413   CREATININE 0.98 06/25/2013 0413   CREATININE 1.10 10/26/2011 1451      Component Value Date/Time   CALCIUM 9.4 06/25/2013 0413   ALKPHOS 68 10/26/2011 1451   AST 19 10/26/2011 1451   ALT 26 10/26/2011 1451   BILITOT 0.5 10/26/2011 1451        Chemistry      Component Value Date/Time   NA 137 06/25/2013 0413   K 3.7 06/25/2013 0413   CL 100  06/25/2013 0413   CO2 25 06/25/2013 0413   BUN 12 06/25/2013 0413   CREATININE 0.98 06/25/2013 0413   CREATININE 1.10 10/26/2011 1451      Component Value Date/Time   CALCIUM 9.4 06/25/2013 0413   ALKPHOS 68 10/26/2011 1451   AST 19 10/26/2011 1451   ALT 26 10/26/2011 1451   BILITOT 0.5 10/26/2011 1451       Last optometry/ophthalmology exam reviewed from:    Assessment:        Plan:    1.  Rx changes: {none:33079} 2.  Education: Reviewed 'ABCs' of diabetes management (respective goals in parentheses):  A1C (<7), blood pressure (<130/80), and cholesterol (LDL <100). 3.  Compliance at present is estimated to be {good/fair/poor:33178}. Efforts to improve compliance (if necessary) will be directed at {compliance:16716}. 4. Follow up: {NUMBERS; 0-10:33138} {time:11}

## 2014-11-25 NOTE — Patient Instructions (Signed)
Increase your insulin by 2 units every 2 days until your morning fasting blood sugars under 120.

## 2014-11-25 NOTE — Progress Notes (Signed)
Subjective:    Paul Dudley is a 48 y.o. male who presents for follow-up of Type 2 diabetes mellitus.    Home blood sugar records: patient test bid.  Fasting & postprandial are in 175 range.  .  Persistent productive cough for last 6 weeks.  Has associated abdominal pain over the last three weeks with  Vomiting.  His symptoms are associated with fatty food intake.  He has not noticed diarrhea or a change in the nature of his stools. Daily foot checks: Yes.   Any foot concerns: None. Last eye exam:  10/16/14   Medication compliance: good. Current diet: has changed eating has been to classes.  Reduced potassium intake. Current exercise: taking care of father  Known diabetic complications: none Cardiovascular risk factors: diabetes mellitus, dyslipidemia, hypertension and male gender   He complains of a chronic, productive cough with thin yellowy mucus x 6 weeks.  He has not had sinus/nasal involvement, headaches, or fever.  He is a non-smoker without allergies.  Of note he has a history of GERD and has been experiencing abdominal pain.  This pain is epigastric and RUQ, bothering him worst after eating.  His back pain is progressive and has been evaluated in the past.  He currently experiences shooting pains from his buttocks down the back of his thigh. This has limited his ability to work, and he has a disability claim outstanding.  In the meantime he is without health insurance  He is experiencing significant psychosocial stress due to having to care for his father with ESRD on 3x weekly HD living in [place] over the last 3 months.  The patient is returning shortly and preventing him from taken care of his family.  He is running into roadblocks with [place].  Patient denies chest pain, dyspnea, orthopnea, transient loss of consciousness, constipation, diarrhea, and symptoms of claudication.   Objective:    BP 130/80 mmHg  Pulse 115  Temp(Src) 98.7 F  (37.1 C)  Wt 157 lb (71.215 kg)  SpO2 99%  Filed Vitals:   11/25/14 1501  BP: 130/80  Pulse: 115  Temp: 98.7 F (37.1 C)    General appearence: alert, no distress, WD/WN Neck: supple, no lymphadenopathy, no thyromegaly, no masses.  No scleral or palatal icterus. Heart: RRR, normal S1, S2, no murmurs Lungs: CTA bilaterally, no wheezes, rhonchi, or rales Abdomen: +bs, soft, non-distended, + guarding and exquisite tenderness best localized to RUQ.  + Murphy's sign. No rebound tenderness. Pulses: 2+ symmetric, upper and lower extremities, normal cap refill Ext: no edema Foot exam: No deformities or ulceration apparent with 2+ pedal and PT pulses bilaterally.  Vibratory sensation intact Neuro: foot monofilament exam normal   Lab Review Lab Results  Component Value Date   HGBA1C 11.0 07/19/2014   Lab Results  Component Value Date   CHOL 226* 03/12/2013   HDL 34* 03/12/2013   LDLCALC 147* 03/12/2013   TRIG 223* 03/12/2013   CHOLHDL 6.6 03/12/2013   No results found for: Concepcion ElkMICROALBUR, [id number]   Chemistry      Component Value Date/Time   NA 137 06/25/2013 0413   K 3.7 06/25/2013 0413   CL 100 06/25/2013 0413   CO2 25 06/25/2013 0413   BUN 12 06/25/2013 0413   CREATININE 0.98 06/25/2013 0413   CREATININE 1.10 10/26/2011 1451      Component Value Date/Time   CALCIUM 9.4 06/25/2013 0413   ALKPHOS 68 10/26/2011 1451   AST 19  10/26/2011 1451   ALT 26 10/26/2011 1451   BILITOT 0.5 10/26/2011 1451        Chemistry      Component Value Date/Time   NA 137 06/25/2013 0413   K 3.7 06/25/2013 0413   CL 100 06/25/2013 0413   CO2 25 06/25/2013 0413   BUN 12 06/25/2013 0413   CREATININE 0.98 06/25/2013 0413   CREATININE 1.10 10/26/2011 1451      Component Value Date/Time   CALCIUM 9.4 06/25/2013 0413   ALKPHOS 68 10/26/2011 1451   AST 19 10/26/2011 1451   ALT 26 10/26/2011 1451   BILITOT 0.5 10/26/2011 1451     Hemoglobin A1c 11.0   Assessment:   1.  Rx  changes: Increased insulin by 2 units nightly until morning glucose <120 2.  Education: Reviewed 'ABCs' of diabetes management (respective goals in parentheses):  A1C (<7), blood pressure (<130/80), and cholesterol (LDL <100). 3.  Compliance at present is estimated to be poor. Efforts to improve compliance (if necessary) will be directed at improved medication and glucose monitoring adherence.  4. Follow up: 4 months    The etiology of his cough and abdominal pain are unclear at this point.  His exam localizes well to the RUQ and he does report some dependence of his symptoms on consumption of fatty foods.  He also has a history of GERD and reports symptoms of dyspepsia.  To evaluate for a potential cholecystitis we will evaluate him with a RUQ US.  His exam at this time is non-peritoneal and he does not appear toxic.  We are therefore have low concern for emergent evaluation.  - C. Merlyn Lotaylor Nipp, MS3, [facility] Seen with Dr. Ronnald NianJohn C. Dresden Lozito who is in agreement with the above findings and plan.

## 2014-11-26 ENCOUNTER — Ambulatory Visit (HOSPITAL_COMMUNITY)
Admission: RE | Admit: 2014-11-26 | Discharge: 2014-11-26 | Disposition: A | Discharge status: Home / Self Care | Payer: Self-pay | Source: Ambulatory Visit | Attending: Family Medicine | Admitting: Family Medicine

## 2014-11-26 ENCOUNTER — Telehealth: Payer: Self-pay | Admitting: Internal Medicine

## 2014-11-26 DIAGNOSIS — R1011 Right upper quadrant pain: Secondary | ICD-10-CM | POA: Insufficient documentation

## 2014-11-26 MED ORDER — BENZONATATE 100 MG PO CAPS: 100.0000 mg | ORAL_CAPSULE | Freq: Three times a day (TID) | ORAL | Status: DC | PRN

## 2014-11-26 NOTE — Telephone Encounter (Signed)
Pt called stating that his cough is not any better and he would like something for it. Send to [facility] [address]

## 2014-12-03 ENCOUNTER — Ambulatory Visit: Payer: Self-pay | Admitting: *Deleted

## 2014-12-30 ENCOUNTER — Ambulatory Visit: Payer: Self-pay | Admitting: *Deleted

## 2015-01-13 ENCOUNTER — Ambulatory Visit: Payer: Self-pay | Admitting: *Deleted

## 2015-03-31 ENCOUNTER — Ambulatory Visit: Payer: Self-pay | Admitting: Family Medicine

## 2015-04-05 ENCOUNTER — Ambulatory Visit (INDEPENDENT_AMBULATORY_CARE_PROVIDER_SITE_OTHER): Payer: Self-pay | Admitting: Family Medicine

## 2015-04-05 ENCOUNTER — Encounter: Payer: Self-pay | Admitting: Family Medicine

## 2015-04-05 VITALS — BP 120/80 | HR 98 | Wt 167.0 lb

## 2015-04-05 DIAGNOSIS — I10 Essential (primary) hypertension: Secondary | ICD-10-CM

## 2015-04-05 DIAGNOSIS — E119 Type 2 diabetes mellitus without complications: Secondary | ICD-10-CM

## 2015-04-05 DIAGNOSIS — E785 Hyperlipidemia, unspecified: Secondary | ICD-10-CM

## 2015-04-05 DIAGNOSIS — E1159 Type 2 diabetes mellitus with other circulatory complications: Secondary | ICD-10-CM

## 2015-04-05 DIAGNOSIS — Z794 Long term (current) use of insulin: Secondary | ICD-10-CM

## 2015-04-05 DIAGNOSIS — M549 Dorsalgia, unspecified: Secondary | ICD-10-CM

## 2015-04-05 DIAGNOSIS — E1169 Type 2 diabetes mellitus with other specified complication: Secondary | ICD-10-CM

## 2015-04-05 DIAGNOSIS — G8929 Other chronic pain: Secondary | ICD-10-CM

## 2015-04-05 LAB — POCT GLYCOSYLATED HEMOGLOBIN (HGB A1C): Hemoglobin A1C: 10.2

## 2015-04-05 NOTE — Progress Notes (Signed)
   Subjective:    Patient ID: Paul Dudley, male    DOB: 05/29/1966, 49 y.o.   MRN: [id number]  HPI He is here for a diabetes recheck. He has been to the diabetes education classes. He has been checking his sugars in the morning and then one half hour after meals. He continues on the other medications listed in the chart. He asked for pain medication. He is using 2 Advil every 4 hours for his pain. He is apparently going to qualify for Medicare. He is also trying to get Social Security disability.   Review of Systems     Objective:   Physical Exam Her to and in no distress. Hemoglobin A1c is 10.2       Assessment & Plan:  Diabetes mellitus type 2, insulin dependent - Plan: POCT glycosylated hemoglobin (Hb A1C)  Hypertension associated with diabetes  Hyperlipidemia with target LDL less than 70  Chronic back pain I explained that he needs to check his blood sugars either before a meal or 2 hours after a meal and other times the readings are difficult to interpret. He is to continue on his other medications. He is to bring in all of his blood sugar readings when he comes in for his next visit.Recommend 4 Advil 3 times per day for his back pain. Explained that did not want to get involved in chronic back pain management.

## 2015-04-05 NOTE — Patient Instructions (Addendum)
Check your sugars either before a meal or 2 hours after a meal You can take 4 ibuprofen 3 times per day for the back Bring all your blood sugar readings in with you the next time

## 2015-04-15 ENCOUNTER — Telehealth: Payer: Self-pay | Admitting: Medical

## 2015-04-15 NOTE — Telephone Encounter (Signed)
This was called into  [employer]

## 2015-04-20 NOTE — Telephone Encounter (Signed)
Pt Assistance Levemir received 3 boxes, Left message for pt

## 2015-05-04 ENCOUNTER — Telehealth: Payer: Self-pay | Admitting: Internal Medicine

## 2015-05-04 NOTE — Telephone Encounter (Signed)
Faxed over medical records to [facility] on 04/20/15  @336 [phone]

## 2015-05-23 ENCOUNTER — Other Ambulatory Visit: Payer: Self-pay | Admitting: Family Medicine

## 2015-07-19 ENCOUNTER — Telehealth: Payer: Self-pay | Admitting: Internal Medicine

## 2015-07-19 NOTE — Telephone Encounter (Signed)
Faxed over medical records to [facility] @ [phone] today

## 2015-08-05 ENCOUNTER — Ambulatory Visit: Payer: Self-pay | Admitting: Family Medicine

## 2015-08-24 ENCOUNTER — Ambulatory Visit: Payer: Self-pay | Admitting: Family Medicine

## 2015-09-16 ENCOUNTER — Ambulatory Visit: Payer: Self-pay | Admitting: Family Medicine

## 2015-10-07 ENCOUNTER — Ambulatory Visit: Payer: Self-pay | Admitting: Family Medicine

## 2015-10-17 ENCOUNTER — Ambulatory Visit: Payer: Self-pay | Admitting: Family Medicine

## 2015-10-21 ENCOUNTER — Ambulatory Visit: Payer: Self-pay | Admitting: Family Medicine

## 2015-11-02 ENCOUNTER — Ambulatory Visit: Payer: Self-pay | Admitting: Family Medicine

## 2015-11-21 ENCOUNTER — Telehealth: Payer: Self-pay | Admitting: Family Medicine

## 2015-11-21 DIAGNOSIS — E119 Type 2 diabetes mellitus without complications: Secondary | ICD-10-CM

## 2015-11-21 MED ORDER — PIOGLITAZONE HCL 30 MG PO TABS: 30.0000 mg | ORAL_TABLET | Freq: Every day | ORAL | Status: DC

## 2015-11-21 NOTE — Telephone Encounter (Signed)
Pt rescheduled his appt again. He has been rescheduling this appt since Aug. Pt rescheduled to Dec 20. Pt is requesting refills of Actos sent to [facility] on [address]. Pt can be reached at [phone].

## 2015-11-23 ENCOUNTER — Ambulatory Visit: Payer: Self-pay | Admitting: Family Medicine

## 2015-12-06 ENCOUNTER — Ambulatory Visit: Payer: Self-pay | Admitting: Family Medicine

## 2015-12-20 ENCOUNTER — Ambulatory Visit: Payer: Self-pay | Admitting: Family Medicine

## 2015-12-22 ENCOUNTER — Telehealth: Payer: Self-pay | Admitting: Family Medicine

## 2015-12-22 NOTE — Telephone Encounter (Signed)
Pt called and states hopes his Medicare will come in by end of month.  So he rescheduled Jan 17, 2016

## 2015-12-23 ENCOUNTER — Ambulatory Visit: Payer: Self-pay | Admitting: Family Medicine

## 2016-01-05 ENCOUNTER — Telehealth: Payer: Self-pay

## 2016-01-05 NOTE — Telephone Encounter (Signed)
Records from 1/16 to present sent to [facility] on 01/05/16  [phone]

## 2016-01-17 ENCOUNTER — Ambulatory Visit: Payer: Self-pay | Admitting: Family Medicine

## 2016-01-19 ENCOUNTER — Ambulatory Visit: Payer: Self-pay | Admitting: Family Medicine

## 2016-01-20 ENCOUNTER — Ambulatory Visit (INDEPENDENT_AMBULATORY_CARE_PROVIDER_SITE_OTHER): Payer: Self-pay | Admitting: Family Medicine

## 2016-01-20 ENCOUNTER — Encounter: Payer: Self-pay | Admitting: Family Medicine

## 2016-01-20 VITALS — BP 118/74 | HR 90 | Wt 161.0 lb

## 2016-01-20 DIAGNOSIS — E1159 Type 2 diabetes mellitus with other circulatory complications: Secondary | ICD-10-CM

## 2016-01-20 DIAGNOSIS — I10 Essential (primary) hypertension: Secondary | ICD-10-CM

## 2016-01-20 DIAGNOSIS — E119 Type 2 diabetes mellitus without complications: Secondary | ICD-10-CM

## 2016-01-20 DIAGNOSIS — G8929 Other chronic pain: Secondary | ICD-10-CM

## 2016-01-20 DIAGNOSIS — E785 Hyperlipidemia, unspecified: Secondary | ICD-10-CM

## 2016-01-20 DIAGNOSIS — Z794 Long term (current) use of insulin: Secondary | ICD-10-CM

## 2016-01-20 DIAGNOSIS — M549 Dorsalgia, unspecified: Secondary | ICD-10-CM

## 2016-01-20 LAB — POCT GLYCOSYLATED HEMOGLOBIN (HGB A1C): HEMOGLOBIN A1C: 10.4

## 2016-01-20 NOTE — Progress Notes (Signed)
   Subjective:    Patient ID: Paul Dudley, male    DOB: 09-02-1966, 50 y.o.   MRN: [id number]  HPI He is here for a follow-up visit. He still does not have insurance and is applying for disability. Has run out of his insulin. He also needs refills on his other medications.   Review of Systems     Objective:   Physical Exam Alert and in no distress otherwise not examined. Hemoglobin A1c is 10.4      Assessment & Plan:  Diabetes mellitus type 2, insulin dependent (HCC) - Plan: POCT glycosylated hemoglobin (Hb A1C)  Hypertension associated with diabetes (HCC)  Chronic back pain  Hyperlipidemia with target LDL less than 70 His prescription for renewed. I did give him a sample of long-acting insulin. We'll also refer him to the [facility] adult [facility] since he has no insurance. I reassured him that once he gets insurance he can return to my practice. He was comfortable with this.

## 2016-01-25 ENCOUNTER — Other Ambulatory Visit: Payer: Self-pay | Admitting: Family Medicine

## 2016-07-04 IMAGING — US US ABDOMEN LIMITED
1 series · 14 of 25 positions shown · non-contrast
Comparison: None.

CLINICAL DATA: Right upper quadrant pain P

EXAM:
US ABDOMEN LIMITED - RIGHT UPPER QUADRANT

[Series 1: us abdomen limited · 0.24mm/px · 14 of 62 slices shown]
[im 1/62]
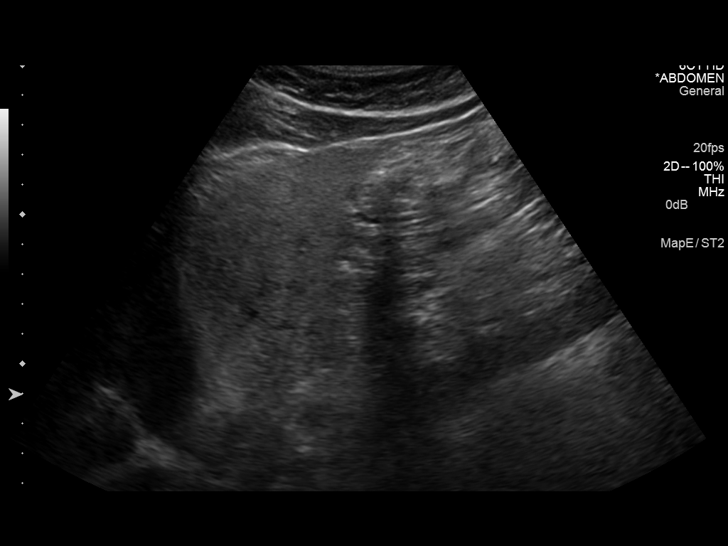
[im 6/62]
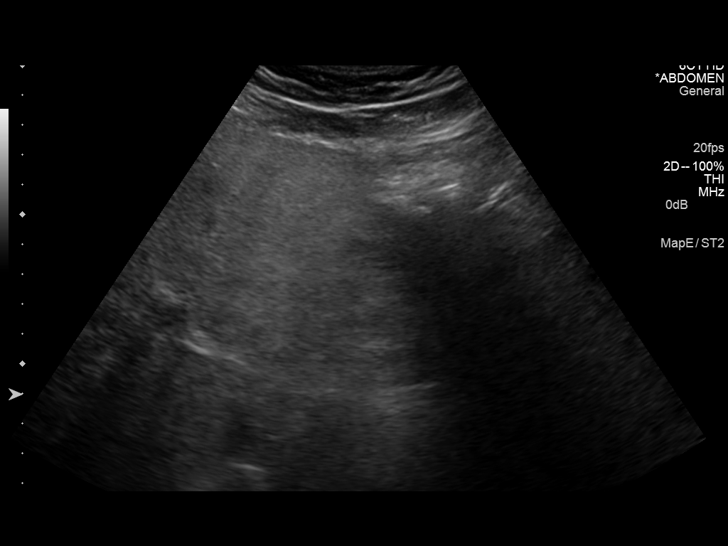
[im 11/62]
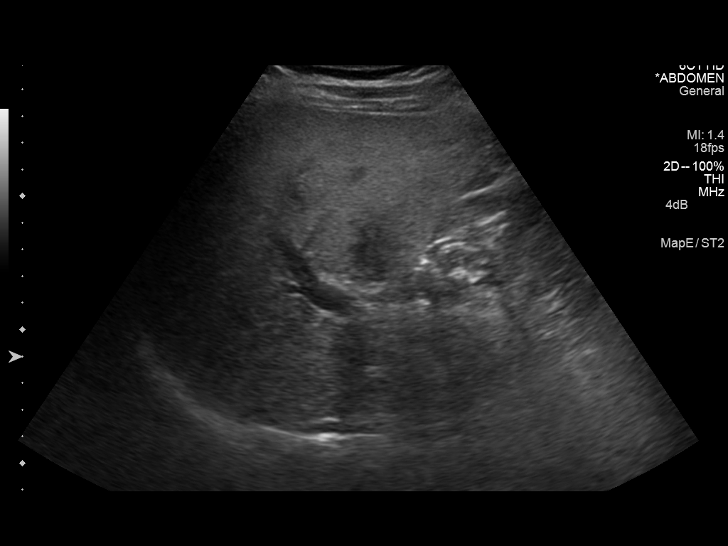
[im 16/62]
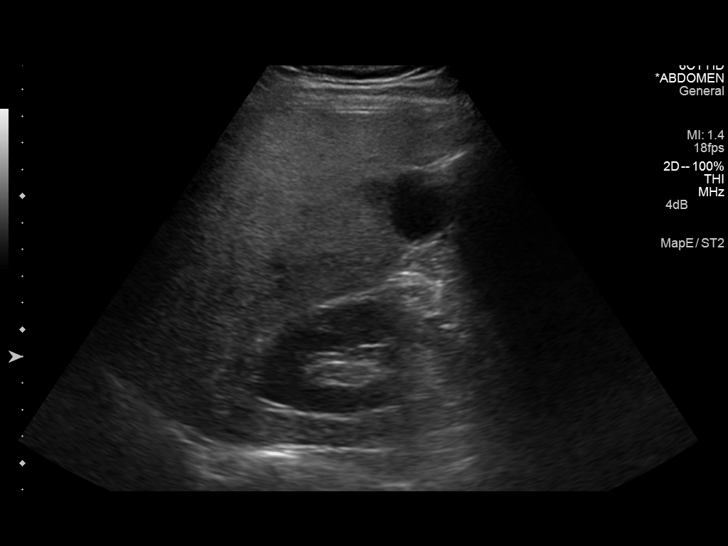
[im 21/62]
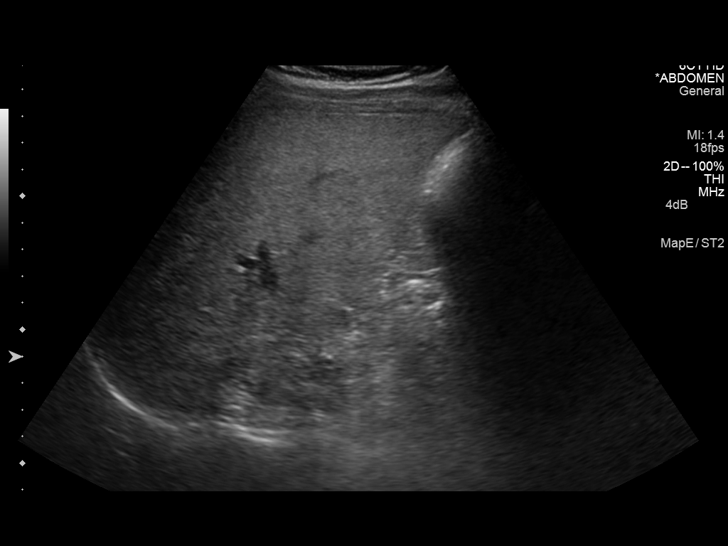
[im 23/62]
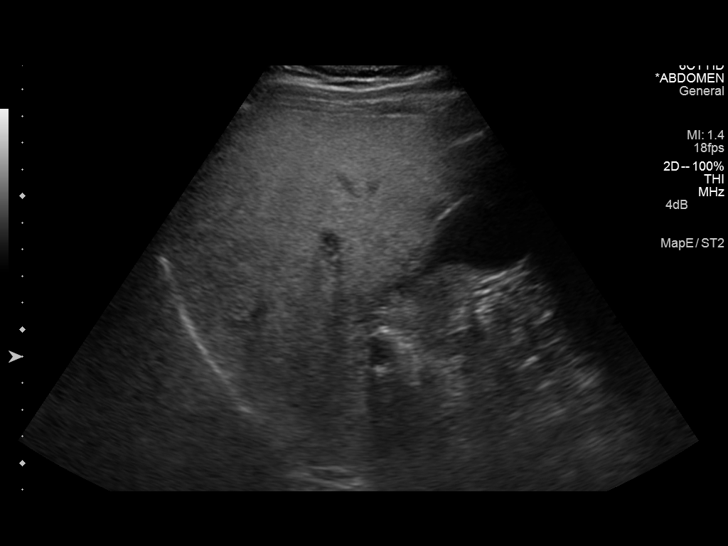
[im 28/62]
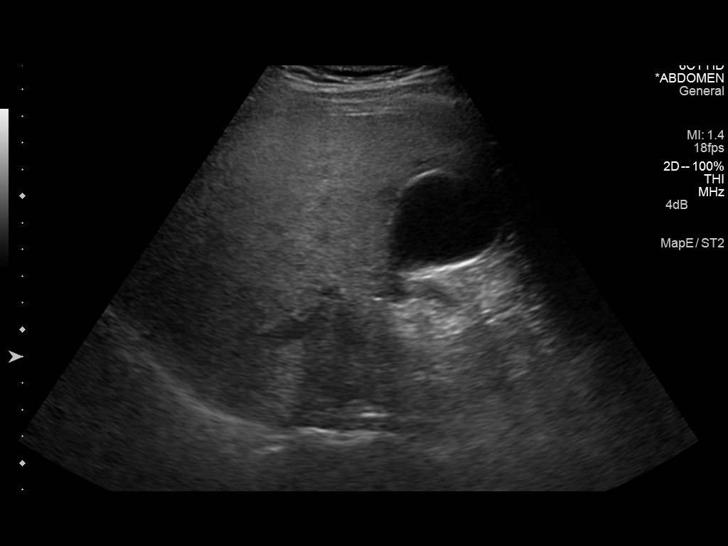
[im 34/62]
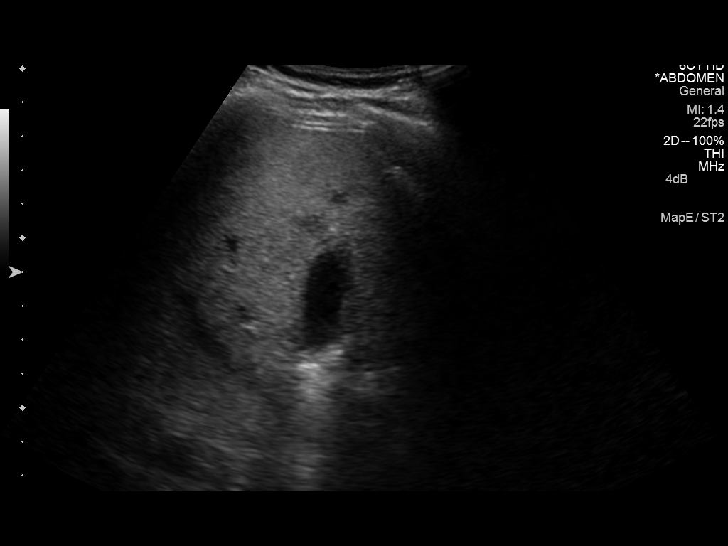
[im 39/62]
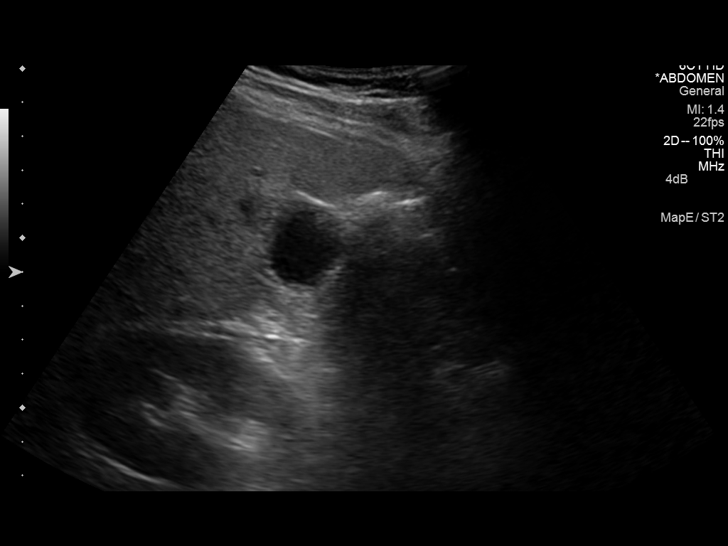
[im 41/62]
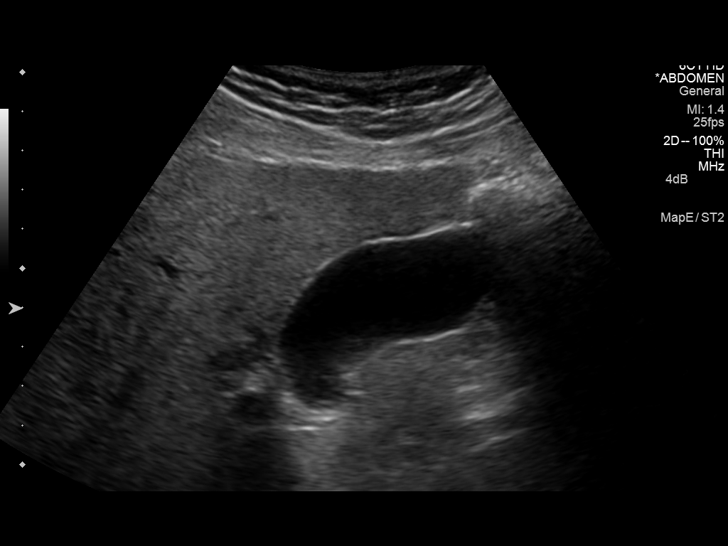
[im 46/62]
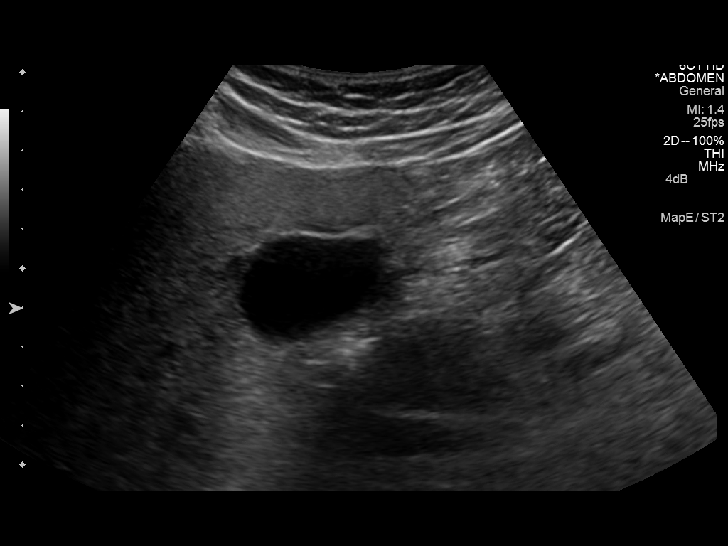
[im 51/62]
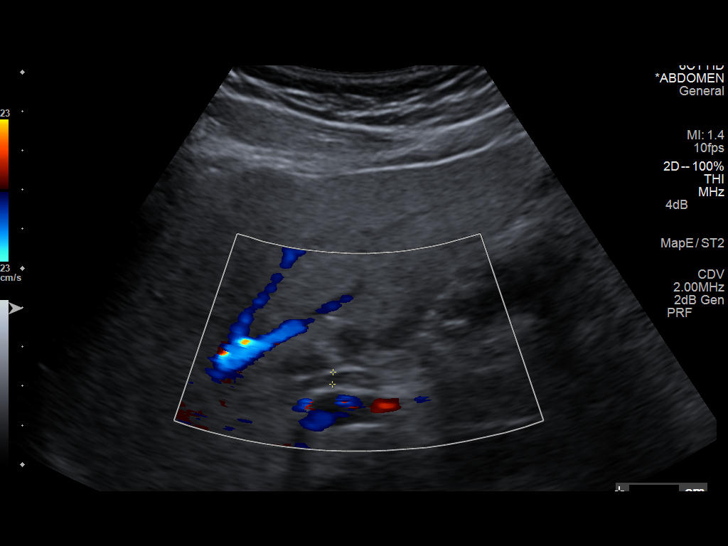
[im 56/62]
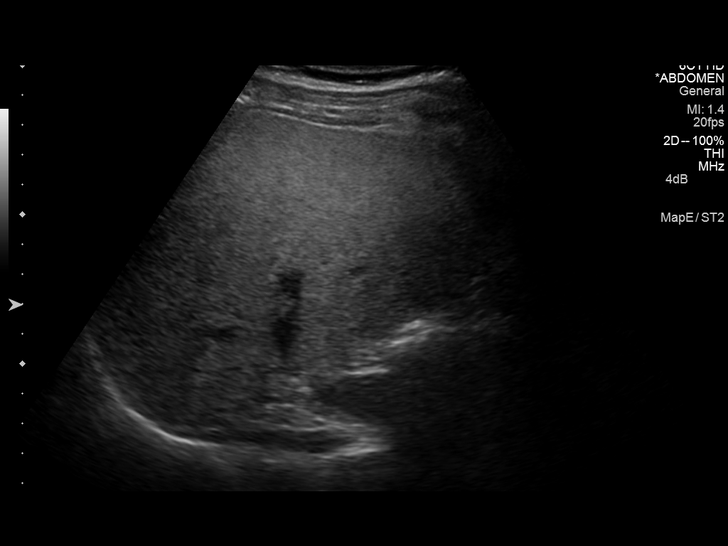
[im 62/62]
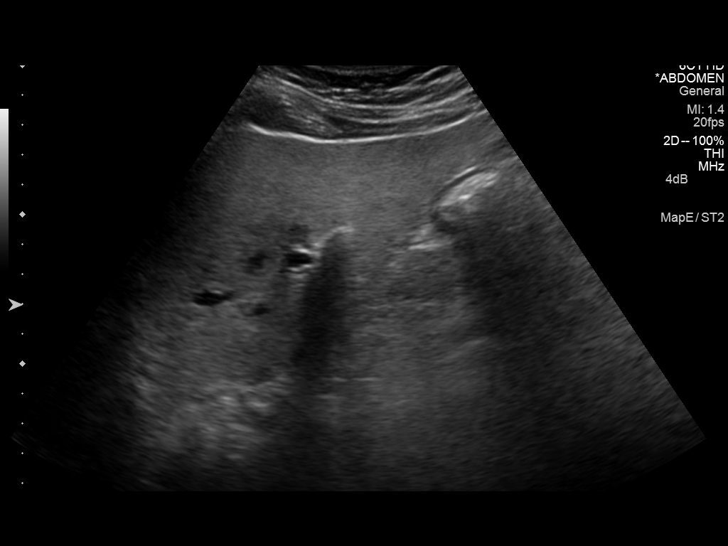

[14 of 25 positions shown; findings below may reference images not displayed]

FINDINGS: Gallbladder:

No gallstones or wall thickening visualized. No sonographic Murphy
sign noted.

Common bile duct:

Diameter: 3.1 mm.

Liver:

Liver is echogenic consistent fatty infiltration. No focal hepatic
abnormality.
IMPRESSION: Echogenic liver suggesting fatty infiltration.
